# Patient Record
Sex: Male | Born: 1955
Health system: Southern US, Community
[De-identification: ages and names within clinical notes are randomized; demographics above are authoritative.]

## PROBLEM LIST (undated history)

## (undated) DIAGNOSIS — E785 Hyperlipidemia, unspecified: Secondary | ICD-10-CM

## (undated) DIAGNOSIS — R05 Cough: Secondary | ICD-10-CM

## (undated) DIAGNOSIS — R053 Chronic cough: Secondary | ICD-10-CM

## (undated) DIAGNOSIS — F410 Panic disorder [episodic paroxysmal anxiety] without agoraphobia: Secondary | ICD-10-CM

## (undated) DIAGNOSIS — K219 Gastro-esophageal reflux disease without esophagitis: Secondary | ICD-10-CM

## (undated) DIAGNOSIS — Z77090 Contact with and (suspected) exposure to asbestos: Secondary | ICD-10-CM

## (undated) DIAGNOSIS — G20 Parkinson's disease: Secondary | ICD-10-CM

## (undated) HISTORY — DX: Hyperlipidemia, unspecified: E78.5

## (undated) HISTORY — PX: TONSILLECTOMY: SUR1361

## (undated) HISTORY — DX: Contact with and (suspected) exposure to asbestos: Z77.090

## (undated) HISTORY — PX: COLONOSCOPY W/ POLYPECTOMY: SHX1380

## (undated) HISTORY — PX: APPENDECTOMY: SHX54

## (undated) HISTORY — PX: UPPER GASTROINTESTINAL ENDOSCOPY: SHX188

## (undated) HISTORY — DX: Parkinson's disease: G20

---

## 2000-01-01 ENCOUNTER — Other Ambulatory Visit: Admission: RE | Admit: 2000-01-01 | Discharge: 2000-01-01 | Payer: Self-pay | Admitting: Gastroenterology

## 2000-01-01 ENCOUNTER — Encounter (INDEPENDENT_AMBULATORY_CARE_PROVIDER_SITE_OTHER): Payer: Self-pay

## 2004-03-21 ENCOUNTER — Ambulatory Visit: Payer: Self-pay | Admitting: Gastroenterology

## 2004-03-23 ENCOUNTER — Ambulatory Visit: Payer: Self-pay | Admitting: Gastroenterology

## 2004-03-29 ENCOUNTER — Ambulatory Visit: Payer: Self-pay | Admitting: Gastroenterology

## 2005-01-12 ENCOUNTER — Ambulatory Visit: Payer: Self-pay | Admitting: Gastroenterology

## 2005-02-01 ENCOUNTER — Ambulatory Visit (HOSPITAL_COMMUNITY): Admission: RE | Admit: 2005-02-01 | Discharge: 2005-02-01 | Payer: Self-pay | Admitting: Gastroenterology

## 2005-02-01 ENCOUNTER — Ambulatory Visit: Payer: Self-pay | Admitting: Gastroenterology

## 2005-02-27 ENCOUNTER — Ambulatory Visit: Payer: Self-pay | Admitting: Gastroenterology

## 2005-12-13 ENCOUNTER — Encounter: Admission: RE | Admit: 2005-12-13 | Discharge: 2005-12-13 | Payer: Self-pay | Admitting: Gastroenterology

## 2010-04-19 ENCOUNTER — Encounter: Admission: RE | Admit: 2010-04-19 | Discharge: 2010-04-19 | Payer: Self-pay | Admitting: Orthopedic Surgery

## 2010-06-10 ENCOUNTER — Encounter: Payer: Self-pay | Admitting: Gastroenterology

## 2010-06-12 ENCOUNTER — Encounter: Payer: Self-pay | Admitting: Gastroenterology

## 2010-10-06 NOTE — Op Note (Signed)
NAMEJANZIEL, HOCKETT                ACCOUNT NO.:  [id number]   MEDICAL RECORD NO.:  [id number]          PATIENT TYPE:  AMB   LOCATION:  ENDO                         FACILITY:  [facility]   PHYSICIAN:  Vania Rea. Jarold Motto, M.D. [facility] OF BIRTH:  [birth year]   DATE OF PROCEDURE:  02/01/2005  DATE OF DISCHARGE:  02/01/2005                                 OPERATIVE REPORT   PROCEDURE:  Esophageal manometry.   1.  Upper esophageal sphincter:  There appears to be normal coordination      between pharyngeal contraction and cricopharyngeal relaxation.  2.  Lower esophageal sphincter:  Lower esophageal sphincter pressure is      borderline at 15 mmHg with normal relaxation on swallowing.  3.  Motility pattern:  There are normally-propagated peristaltic waves of      normal amplitude and duration throughout the length of the esophagus.      Mean amplitude of contractions is 102 mmHg.   ASSESSMENT:  This is a normal esophageal manometry except for a borderline  lower esophageal sphincter.  There is certainly no evidence of an esophageal  motility disorder.           ______________________________  Vania Rea. Jarold Motto, M.D. [facility]     DRP/MEDQ  D:  02/23/2005  T:  02/23/2005  Job:  [id number]

## 2010-10-06 NOTE — Op Note (Signed)
NAMELIMUEL, NIEBLAS                ACCOUNT NO.:  [id number]   MEDICAL RECORD NO.:  [id number]          PATIENT TYPE:  AMB   LOCATION:  ENDO                         FACILITY:  [facility]   PHYSICIAN:  Vania Rea. Jarold Motto, M.D. [facility] OF BIRTH:  [birth year]   DATE OF PROCEDURE:  02/02/2005  DATE OF DISCHARGE:  02/01/2005                                 OPERATIVE REPORT   PROCEDURE:  Ambulatory 24-hour pH probe study.   A 24-hour ambulatory pH study was completed on February 02, 2005.  The  patient is on Nexium 40 mg twice a day.  There is absolutely no acid reflux  whatsoever on this recording in the upright, recumbent or supine positions.  The patient had frequent chest pain throughout the examination, but there is  no correlation of any of his symptoms with acid reflux occurrences.   ASSESSMENT:  I sincerely doubt this patient is having acid reflux as a cause  for his very atypical chest pain.  He does have known Barrett's mucosa and a  small hiatal hernia, but he seems to have good control at this point in  terms of his acid reflux problems on twice a day PPI therapy.  I seriously  doubt he would benefit from fundoplication surgery.  We will see him in the  office for follow-up as scheduled.           ______________________________  Vania Rea. Jarold Motto, M.D. [facility]     DRP/MEDQ  D:  02/23/2005  T:  02/23/2005  Job:  [id number]

## 2010-11-07 ENCOUNTER — Other Ambulatory Visit: Payer: Self-pay | Admitting: Family Medicine

## 2010-11-07 DIAGNOSIS — R209 Unspecified disturbances of skin sensation: Secondary | ICD-10-CM

## 2010-11-14 ENCOUNTER — Ambulatory Visit
Admission: RE | Admit: 2010-11-14 | Discharge: 2010-11-14 | Disposition: A | Discharge status: Home / Self Care | Payer: Self-pay | Source: Ambulatory Visit | Attending: Family Medicine | Admitting: Family Medicine

## 2010-11-14 DIAGNOSIS — R209 Unspecified disturbances of skin sensation: Secondary | ICD-10-CM

## 2011-07-13 ENCOUNTER — Encounter: Payer: Self-pay | Admitting: Internal Medicine

## 2011-07-13 ENCOUNTER — Ambulatory Visit (INDEPENDENT_AMBULATORY_CARE_PROVIDER_SITE_OTHER): Payer: PRIVATE HEALTH INSURANCE | Admitting: Internal Medicine

## 2011-07-13 VITALS — BP 106/70 | HR 80 | Temp 98.4°F | Ht 66.0 in | Wt 172.0 lb

## 2011-07-13 DIAGNOSIS — R05 Cough: Secondary | ICD-10-CM

## 2011-07-13 DIAGNOSIS — R059 Cough, unspecified: Secondary | ICD-10-CM | POA: Insufficient documentation

## 2011-07-13 MED ORDER — FAMOTIDINE 20 MG PO TABS: ORAL_TABLET | ORAL | Status: DC

## 2011-07-13 MED ORDER — TRAMADOL HCL 50 MG PO TABS: ORAL_TABLET | ORAL | Status: AC

## 2011-07-13 MED ORDER — PREDNISONE (PAK) 10 MG PO TABS: ORAL_TABLET | ORAL | Status: AC

## 2011-07-13 NOTE — Progress Notes (Signed)
  Subjective:    Patient ID: Joseph Adams, male    DOB: 04/07/1956  MRN: [id number]  HPI   56 yo [place] never smoker with GERD maintained on ppi  Bid daily never allergies / asthma new onset cough 1st of 2013 referred 07/13/2011 by Dr Clarene Duke to pulmonary clinic.  07/13/2011 1st pulmonary eval cc  Dry cough onset abrupt onset with uri assoc with exp sick wife x 4 days prior to onset, she got better, around 1st of January rx as AB with mucinex and inhaler no better. rx with allergy meds no better. Even on ppi has fullness in epigrastrium assoc with occ sob at rest but no night time symptoms including the cough, and no real change sob with activity (not proportionate). Cough mostly daytime.  Sleeping ok without nocturnal  or early am exacerbation  of respiratory  c/o's or need for noct saba. Also denies any obvious fluctuation of symptoms with weather or environmental changes or other aggravating or alleviating factors except as outlined above      Review of Systems  Constitutional: Negative for fever, chills, activity change, appetite change and unexpected weight change.  HENT: Negative for congestion, sore throat, rhinorrhea, sneezing, trouble swallowing, dental problem, voice change and postnasal drip.   Eyes: Negative for visual disturbance.  Respiratory: Positive for cough. Negative for choking and shortness of breath.   Cardiovascular: Negative for chest pain and leg swelling.  Gastrointestinal: Negative for nausea, vomiting and abdominal pain.  Genitourinary: Negative for difficulty urinating.  Musculoskeletal: Negative for arthralgias.  Skin: Negative for rash.  Psychiatric/Behavioral: Negative for behavioral problems and confusion.       Objective:   Physical Exam  Wt 172  07/13/11 Very anxious [place] male speaks marginal engllish, nad HEENT: nl dentition, turbinates, and orophanx. Nl external ear canals without cough reflex   NECK :  without JVD/Nodes/TM/ nl  carotid upstrokes bilaterally   LUNGS: no acc muscle use, clear to A and P bilaterally without cough on insp or exp maneuvers   CV:  RRR  no s3 or murmur or increase in P2, no edema   ABD:  soft and nontender with nl excursion in the supine position. No bruits or organomegaly, bowel sounds nl  MS:  warm without deformities, calf tenderness, cyanosis or clubbing  SKIN: warm and dry without lesions    NEURO:  alert, approp, no deficits       Assessment & Plan:

## 2011-07-13 NOTE — Patient Instructions (Signed)
The key to effective treatment for your cough is eliminating the non-stop cycle of cough you're stuck in long enough to let your airway heal completely and then see if there is anything still making you cough once you stop the cough suppression, but this should take no more than 5 days to figuure out  First take delsym two tsp every 12 hours and supplement if needed with  tramadol 50 mg up to 2 every 4 hours to suppress the urge to cough. Swallowing water or using ice chips/non mint and menthol containing candies (such as lifesavers or sugarless jolly ranchers) are also effective.  You should rest your voice and avoid activities that you know make you cough.  Once you have eliminated the cough for 3 straight days try reducing the tramadol first,  then the delsym as tolerated.    Try protonix 40 Take 30-60 min before first meal  And last meal of the day and Pepcid 20 mg one bedtime until cough is completely gone for at least a week without the need for cough suppression  I think of reflux for chronic cough like I do oxygen for fire (doesn't cause the fire but once you get the oxygen suppressed it usually goes away regardless of the exact cause).  GERD (REFLUX)  is an extremely common cause of respiratory symptoms, many times with no significant heartburn at all.    It can be treated with medication, but also with lifestyle changes including avoidance of late meals, excessive alcohol, smoking cessation, and avoid fatty foods, chocolate, peppermint, colas, red wine, and acidic juices such as orange juice.  NO MINT OR MENTHOL PRODUCTS SO NO COUGH DROPS  USE SUGARLESS CANDY INSTEAD (jolley ranchers or Stover's)  NO OIL BASED VITAMINS - use powdered substitutes.    Prednisone 10 mg take  4 each am x 2 days,   2 each am x 2 days,  1 each am x2days and stop    If not better in 2 weeks return to clinic

## 2011-07-14 NOTE — Assessment & Plan Note (Signed)
The most common causes of chronic cough in immunocompetent adults include the following: upper airway cough syndrome (UACS), previously referred to as postnasal drip syndrome (PNDS), which is caused by variety of rhinosinus conditions; (2) asthma; (3) GERD; (4) chronic bronchitis from cigarette smoking or other inhaled environmental irritants; (5) nonasthmatic eosinophilic bronchitis; and (6) bronchiectasis.   These conditions, singly or in combination, have accounted for up to 94% of the causes of chronic cough in prospective studies.   Other conditions have constituted no >6% of the causes in prospective studies These have included bronchogenic carcinoma, chronic interstitial pneumonia, sarcoidosis, left ventricular failure, ACEI-induced cough, and aspiration from a condition associated with pharyngeal dysfunction.   .Chronic cough is often simultaneously caused by more than one condition. A single cause has been found from 38 to 82% of the time, multiple causes from 18 to 62%. Multiply caused cough has been the result of three diseases up to 42% of the time.     This is most c/w  Classic Upper airway cough syndrome, so named because it's frequently impossible to sort out how much is  CR/sinusitis with freq throat clearing (which can be related to primary GERD)   vs  causing  secondary (" extra esophageal")  GERD from wide swings in gastric pressure that occur with throat clearing, often  promoting self use of mint and menthol lozenges that reduce the lower esophageal sphincter tone and exacerbate the problem further in a cyclical fashion.   These are the same pts(now being labeled as having irritable larynx syndrome by some cough centers) who not infrequently have failed to tolerate ace inhibitors,  dry powder inhalers or biphosphonates or report having reflux symptoms that don't respond to standard doses of PPI , and are easily confused as having aecopd or asthma flares.  He has pre-existing reflux  symptoms that are hard to control and now refractory cough.  For now try max gerd rx then consider very short trial of reglan as he has failed to respond to approp trials of rx for pnds and asthma

## 2011-07-15 ENCOUNTER — Telehealth: Payer: Self-pay | Admitting: Pulmonary Disease

## 2011-07-15 NOTE — Telephone Encounter (Signed)
Pt called and c/o increased sob last night and this am that is not related to his cough.  He is better now, and talking in more than complete sentences.  Does not sound winded.  I have asked him to call the office first thing in the am to get appt to be seen on Monday.  If he worsens before then, he needs to go to er.

## 2011-07-16 ENCOUNTER — Ambulatory Visit: Payer: PRIVATE HEALTH INSURANCE | Admitting: Internal Medicine

## 2011-07-16 ENCOUNTER — Ambulatory Visit (INDEPENDENT_AMBULATORY_CARE_PROVIDER_SITE_OTHER): Payer: PRIVATE HEALTH INSURANCE | Admitting: Internal Medicine

## 2011-07-16 ENCOUNTER — Encounter: Payer: Self-pay | Admitting: Internal Medicine

## 2011-07-16 DIAGNOSIS — R05 Cough: Secondary | ICD-10-CM

## 2011-07-16 DIAGNOSIS — R0989 Other specified symptoms and signs involving the circulatory and respiratory systems: Secondary | ICD-10-CM

## 2011-07-16 DIAGNOSIS — R059 Cough, unspecified: Secondary | ICD-10-CM

## 2011-07-16 DIAGNOSIS — R0600 Dyspnea, unspecified: Secondary | ICD-10-CM

## 2011-07-16 NOTE — Assessment & Plan Note (Signed)
Improved on rx but prednisone may be fueling resting sob/ hyperventilation typical of anxiety so try off

## 2011-07-16 NOTE — Patient Instructions (Addendum)
The key to effective treatment for your cough is eliminating the non-stop cycle of cough you're stuck in long enough to let your airway heal completely and then see if there is anything still making you cough once you stop the cough suppression, but this should take no more than 5 days to figuure out  First take delsym two tsp every 12 hours and supplement if needed with  tramadol 50 mg up to 2 every 4 hours to suppress the urge to cough. Swallowing water or using ice chips/non mint and menthol containing candies (such as lifesavers or sugarless jolly ranchers) are also effective.  You should rest your voice and avoid activities that you know make you cough.  Once you have eliminated the cough for 3 straight days try reducing the tramadol first,  then the delsym as tolerated.    Try protonix 40 Take 30-60 min before first meal  And last meal of the day and Pepcid 20 mg one bedtime until cough is completely gone for at least a week without the need for cough suppression  I think of reflux for chronic cough like I do oxygen for fire (doesn't cause the fire but once you get the oxygen suppressed it usually goes away regardless of the exact cause).  GERD (REFLUX)  is an extremely common cause of respiratory symptoms, many times with no significant heartburn at all.    It can be treated with medication, but also with lifestyle changes including avoidance of late meals, excessive alcohol, smoking cessation, and avoid fatty foods, chocolate, peppermint, colas, red wine, and acidic juices such as orange juice.  NO MINT OR MENTHOL PRODUCTS SO NO COUGH DROPS  USE SUGARLESS CANDY INSTEAD (jolley ranchers or Stover's)  NO OIL BASED VITAMINS - use powdered substitutes.   Prednisone> ok to  stop at this point as it may be fueling your anxiety  Ok to use diazepam and tramadol together but together they will make you a little drowsier    If not better in 2 weeks return to clinic

## 2011-07-16 NOTE — Assessment & Plan Note (Addendum)
-   07/16/2011  Walked RA x 3 laps @ 185 ft each stopped due to  End of study, no desat   Symptoms are markedly disproportionate to objective findings and not reproducible with activity so   not clear this is actually a lung problem but pt does appear to have difficult airway management issues. DDX of  difficult airways managment all start with A and  include Adherence, Ace Inhibitors, Acid Reflux, Active Sinus Disease, Alpha 1 Antitripsin deficiency, Anxiety masquerading as Airways dz,  ABPA,  allergy(esp in young), Aspiration (esp in elderly), Adverse effects of DPI,  Active smokers, plus two Bs  = Bronchiectasis and Beta blocker use..and one C= CHF  Anxiety usually a dx of exclusion but in this case it is a longstanding issue.   acid reflux > reviewed diet

## 2011-07-16 NOTE — Progress Notes (Signed)
Subjective:    Patient ID: Joseph Adams, male    DOB: [birth year]  MRN: [id number]  HPI   56 yo [place] never smoker with GERD maintained on ppi  Bid daily never allergies / asthma but cc sob typically at rest, never with rugby,  Happens sporadically  new onset cough 1st of 2013 referred 07/13/2011 by Dr Clarene Duke to pulmonary clinic.  07/13/2011 1st pulmonary eval cc  Dry cough onset abrupt onset with uri assoc with exp sick wife x 4 days prior to onset, she got better, around 1st of January rx as AB with mucinex and inhaler no better. rx with allergy meds no better. Even on ppi has fullness in epigrastrium assoc with occ sob at rest but no night time symptoms including the cough, and no real change sob with activity (not proportionate). Cough mostly daytime. rec The key to effective treatment for your cough is eliminating the non-stop cycle of cough you're stuck in long enough to let your airway heal completely and then see if there is anything still making you cough once you stop the cough suppression, but this should take no more than 5 days to figuure out  First take delsym two tsp every 12 hours and supplement if needed with  tramadol 50 mg up to 2 every 4 hours to suppress the urge to cough. Swallowing water or using ice chips/non mint and menthol containing candies (such as lifesavers or sugarless jolly ranchers) are also effective.  You should rest your voice and avoid activities that you know make you cough. Once you have eliminated the cough for 3 straight days try reducing the tramadol first,  then the delsym as tolerated.   Try protonix 40 Take 30-60 min before first meal  And last meal of the day and Pepcid 20 mg one bedtime until cough is completely gone  GERD  diet Prednisone 10 mg take  4 each am x 2 days,   2 each am x 2 days,  1 each am x2days and stop       07/16/2011 f/u ov/Bilan Tedesco cc much worse sob at rest p prednisone started acutely worse since 2/23.  Cough much better/  symptom of sob at rest comes and goes and dates back to childhood but never sleeping or with exercise including rugby.  Better p uses diazepam  Sleeping ok without nocturnal  or early am exacerbation  of respiratory  c/o's or need for noct saba. Also denies any obvious fluctuation of symptoms with weather or environmental changes or other aggravating or alleviating factors except as outlined above.  ROS  At present neg for  any significant sore throat, dysphagia, itching, sneezing,  nasal congestion or excess/ purulent secretions,  fever, chills, sweats, unintended wt loss, pleuritic or exertional cp, hempoptysis, orthopnea pnd or leg swelling.  Also denies presyncope, palpitations, heartburn, abdominal pain, nausea, vomiting, diarrhea  or change in bowel or urinary habits, dysuria,hematuria,  rash, arthralgias, visual complaints, headache, numbness weakness or ataxia.        .       Objective:   Physical Exam  Wt 172  07/13/11 > 172 07/16/2011   Very anxious [place] male speaks marginal engllish, nad x freq sigh breathing noted at rest  HEENT: nl dentition, turbinates, and orophanx. Nl external ear canals without cough reflex   NECK :  without JVD/Nodes/TM/ nl carotid upstrokes bilaterally   LUNGS: no acc muscle use, clear to A and P bilaterally without cough on insp or  exp maneuvers   CV:  RRR  no s3 or murmur or increase in P2, no edema   ABD:  soft and nontender with nl excursion in the supine position. No bruits or organomegaly, bowel sounds nl  MS:  warm without deformities, calf tenderness, cyanosis or clubbing          Assessment & Plan:

## 2011-07-23 ENCOUNTER — Telehealth: Payer: Self-pay | Admitting: Internal Medicine

## 2011-07-23 NOTE — Telephone Encounter (Signed)
i pulled up our [facility] system and was able to view pt's cxr that was done 06/21/11.  Called and spoke with Clydie Braun in Radiology at [facility] and informed her of this.  Nothing further needed.

## 2011-07-23 NOTE — Telephone Encounter (Signed)
Nothing else to offer but needs to see Tammy with all meds in hand in meantime if not able to wait until Friday

## 2011-07-23 NOTE — Telephone Encounter (Signed)
C/o chest tightness, productive cough x 2 days, some SOB having to "yawn to catch air". Has been taking Delsym every 12 hours and when asked if he has been using the Tramadol he advised MW told him not to take it because of the SOB... Pt states he is not sure if MW reviewed his xray from [place] and he is "concerned something is wrong and does not want to wait until Friday." Please advise, thanks.  Denies n/v/d/f/s, wheezing.

## 2011-07-23 NOTE — Telephone Encounter (Signed)
Pt aware and states he can wait until Friday. Carron Curie, CMA

## 2011-07-25 ENCOUNTER — Telehealth: Payer: Self-pay | Admitting: Internal Medicine

## 2011-07-25 ENCOUNTER — Ambulatory Visit (INDEPENDENT_AMBULATORY_CARE_PROVIDER_SITE_OTHER)
Admission: RE | Admit: 2011-07-25 | Discharge: 2011-07-25 | Disposition: A | Discharge status: Home / Self Care | Payer: PRIVATE HEALTH INSURANCE | Source: Ambulatory Visit | Attending: Pulmonary Disease | Admitting: Pulmonary Disease

## 2011-07-25 ENCOUNTER — Ambulatory Visit (INDEPENDENT_AMBULATORY_CARE_PROVIDER_SITE_OTHER): Payer: PRIVATE HEALTH INSURANCE | Admitting: Pulmonary Disease

## 2011-07-25 ENCOUNTER — Encounter: Payer: Self-pay | Admitting: Pulmonary Disease

## 2011-07-25 VITALS — BP 102/80 | HR 76 | Temp 98.3°F | Ht 66.0 in | Wt 174.0 lb

## 2011-07-25 DIAGNOSIS — R059 Cough, unspecified: Secondary | ICD-10-CM

## 2011-07-25 DIAGNOSIS — R05 Cough: Secondary | ICD-10-CM

## 2011-07-25 NOTE — Patient Instructions (Signed)
Chest xray today Will schedule breathing test (PFT) Follow up with Dr. Sherene Sires in 2 weeks

## 2011-07-25 NOTE — Telephone Encounter (Signed)
I spoke with the pt and he is c/o increased SOB, and cough and chest pain on right side x 2 days. He is requesting an appt today. He has an appt to see MW on Friday but he feels like he cannot wait until then. Pt set to see Dr. Craige Cotta today at 2pm. Carron Curie, CMA

## 2011-07-25 NOTE — Assessment & Plan Note (Signed)
He reports developing cough after upper respiratory infection in January 2013.  He has inconsistent response to asthma therapy.  He has not had improvement with anti-reflux therapy.  He denies symptoms of sinus disease or post-nasal drip.  He may have a component of anxiety contributing to his symptoms.  Will repeat his chest xray today, and arrange for pulmonary function testing to further assess.  I explained that we will be able to assess whether his symptoms are related to his prior history of asbestosis exposure after reviewing his chest xray.  However, I do not think this is a significant contributor to his symptoms.  He has noticed symptomatic improvement with delsym, and I have advised him to continue this for now.

## 2011-07-25 NOTE — Progress Notes (Signed)
Chief Complaint  Patient presents with  . Acute visit    MW patient...pt c/o increased sob every morning, prod and non-prod cough present    History of Present Illness: Joseph Adams is a 56 y.o. male never smoker with dyspnea, cough and GERD followed by Dr. Sherene Sires.  He developed a cold in January.  He has suffered from a cough every since.  He was started on inhaler therapy and allergy therapy, but these did not help.  He was then given prednisone.  This helped, but then he was weaned off.  His cough does not happen at night.  He will usually have a dry cough, but occasionally has clear sputum.  He does not have wheeze.  He denies sinus congestion, post-nasal drip, or ear pain.    He takes protonix twice per day, and pepcid at night.  This regimen has not improved his cough.  He does get a sore throat, but denies globus.  He works with [employer].  He denies smoking.  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  He has not had any recent sick exposure.  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  He will notice feeling anxious when he coughs, and then feels like he can't breath.  He has to then step out to get some air.  He has used valium, and this helps.  He is not sure if the anxious feeling precedes his cough and dyspnea.  He denies chest pain.  Past Medical History  Diagnosis Date  . Asbestos exposure   . Hyperlipidemia     Past Surgical History  Procedure Date  . Appendectomy age 35    No Known Allergies  Physical Exam:  Blood pressure 102/80, pulse 76, temperature 98.3 F (36.8 C), temperature source Oral, height 5\' 6"  (1.676 m), weight 174 lb (78.926 kg), SpO2 98.00%. Body mass index is 28.08 kg/(m^2). Wt Readings from Last 2 Encounters:  07/25/11 174 lb (78.926 kg)  07/16/11 172 lb (78.019 kg)    General - No distress HEENT - No sinus tenderness, TM clear, no oral exudate, no LAN Cardiac - s1s2 regular, no murmur Chest - no  wheeze/rales/dullness, normal respiratory excursion Abdomen - soft, nontender, no organomegaly Extremities - no e/c/c Skin - no rashes Neurologic - normal strength, CN intact Psychiatric - flat affect, calm/appropriate   Assessment/Plan:  Outpatient Encounter Prescriptions as of 07/25/2011  Medication Sig Dispense Refill  . atorvastatin (LIPITOR) 40 MG tablet Take 40 mg by mouth daily.      Marland Kitchen dextromethorphan (DELSYM) 30 MG/5ML liquid Take 30 mg by mouth 2 (two) times daily as needed.      . famotidine (PEPCID) 20 MG tablet One at bedtime  30 tablet  11  . pantoprazole (PROTONIX) 40 MG tablet Take 40 mg by mouth 2 (two) times daily.      . tadalafil (CIALIS) 10 MG tablet Take 10 mg by mouth daily as needed.        Yarielys Beed Pager:  [phone] 07/25/2011, 2:25 PM

## 2011-07-26 ENCOUNTER — Telehealth: Payer: Self-pay | Admitting: Pulmonary Disease

## 2011-07-26 NOTE — Telephone Encounter (Signed)
Dr Craige Cotta please advise nurse of xray results to call pt

## 2011-07-26 NOTE — Telephone Encounter (Signed)
Dg Chest 2 View  07/25/2011  *RADIOLOGY REPORT*  Clinical Data: Chronic cough, shortness of breath.  CHEST - 2 VIEW  Comparison: 06/21/2011.  Findings: Heart and mediastinal contours are within normal limits. No focal opacities or effusions.  No acute bony abnormality.  IMPRESSION: No active cardiopulmonary disease.  No change.  Original Report Authenticated By: Cyndie Chime, M.D.    Please inform patient that chest xray was normal.  Will forward results to Dr. Sherene Sires for his review.

## 2011-07-26 NOTE — Telephone Encounter (Signed)
Called and spoke with pt. Informed him of cxr results. Pt verbalized understanding.

## 2011-07-27 ENCOUNTER — Institutional Professional Consult (permissible substitution): Payer: PRIVATE HEALTH INSURANCE | Admitting: Pulmonary Disease

## 2011-07-27 ENCOUNTER — Ambulatory Visit: Payer: PRIVATE HEALTH INSURANCE | Admitting: Internal Medicine

## 2011-08-07 ENCOUNTER — Encounter (HOSPITAL_COMMUNITY): Payer: PRIVATE HEALTH INSURANCE

## 2011-08-07 ENCOUNTER — Ambulatory Visit: Payer: PRIVATE HEALTH INSURANCE | Admitting: Internal Medicine

## 2011-08-16 ENCOUNTER — Ambulatory Visit (INDEPENDENT_AMBULATORY_CARE_PROVIDER_SITE_OTHER): Payer: PRIVATE HEALTH INSURANCE | Admitting: Pulmonary Disease

## 2011-08-16 DIAGNOSIS — R059 Cough, unspecified: Secondary | ICD-10-CM

## 2011-08-16 DIAGNOSIS — R05 Cough: Secondary | ICD-10-CM

## 2011-08-16 DIAGNOSIS — R0609 Other forms of dyspnea: Secondary | ICD-10-CM

## 2011-08-16 DIAGNOSIS — R0989 Other specified symptoms and signs involving the circulatory and respiratory systems: Secondary | ICD-10-CM

## 2011-08-16 DIAGNOSIS — R0600 Dyspnea, unspecified: Secondary | ICD-10-CM

## 2011-08-16 LAB — PULMONARY FUNCTION TEST

## 2011-08-16 NOTE — Progress Notes (Signed)
PFT done today. 

## 2011-08-21 ENCOUNTER — Telehealth: Payer: Self-pay | Admitting: Internal Medicine

## 2011-08-21 NOTE — Telephone Encounter (Signed)
I advised pt MW nor VS was in the office to give him his PFT results and that he has an apt for Friday to review these. He was fine with this and needed nothing further

## 2011-08-24 ENCOUNTER — Encounter: Payer: Self-pay | Admitting: Internal Medicine

## 2011-08-24 ENCOUNTER — Other Ambulatory Visit: Payer: PRIVATE HEALTH INSURANCE

## 2011-08-24 ENCOUNTER — Other Ambulatory Visit (INDEPENDENT_AMBULATORY_CARE_PROVIDER_SITE_OTHER): Payer: PRIVATE HEALTH INSURANCE

## 2011-08-24 ENCOUNTER — Ambulatory Visit (INDEPENDENT_AMBULATORY_CARE_PROVIDER_SITE_OTHER): Payer: PRIVATE HEALTH INSURANCE | Admitting: Internal Medicine

## 2011-08-24 VITALS — BP 110/86 | HR 94 | Temp 98.6°F | Ht 66.0 in | Wt 160.2 lb

## 2011-08-24 DIAGNOSIS — R0600 Dyspnea, unspecified: Secondary | ICD-10-CM

## 2011-08-24 DIAGNOSIS — R059 Cough, unspecified: Secondary | ICD-10-CM

## 2011-08-24 DIAGNOSIS — R0989 Other specified symptoms and signs involving the circulatory and respiratory systems: Secondary | ICD-10-CM

## 2011-08-24 DIAGNOSIS — R05 Cough: Secondary | ICD-10-CM

## 2011-08-24 LAB — CBC WITH DIFFERENTIAL/PLATELET
Basophils Relative: 0.2 % (ref 0.0–3.0)
Eosinophils Absolute: 0 10*3/uL (ref 0.0–0.7)
HCT: 44.1 % (ref 39.0–52.0)
Lymphs Abs: 0.8 10*3/uL (ref 0.7–4.0)
MCHC: 33.5 g/dL (ref 30.0–36.0)
MCV: 87.2 fl (ref 78.0–100.0)
Monocytes Absolute: 0.5 10*3/uL (ref 0.1–1.0)
Neutrophils Relative %: 80 % — ABNORMAL HIGH (ref 43.0–77.0)
Platelets: 216 10*3/uL (ref 150.0–400.0)
RBC: 5.06 Mil/uL (ref 4.22–5.81)

## 2011-08-24 MED ORDER — TRAMADOL HCL 50 MG PO TABS: ORAL_TABLET | ORAL | Status: AC

## 2011-08-24 MED ORDER — PREDNISONE (PAK) 10 MG PO TABS: ORAL_TABLET | ORAL | Status: DC

## 2011-08-24 MED ORDER — PREDNISONE (PAK) 10 MG PO TABS: ORAL_TABLET | ORAL | Status: AC

## 2011-08-24 NOTE — Patient Instructions (Addendum)
The key to effective treatment for your cough is eliminating the non-stop cycle of cough you're stuck in long enough to let your airway heal completely and then see if there is anything still making you cough once you stop the cough suppression, but this should take no more than 5 days to figuure out  First take delsym two tsp every 12 hours and supplement if needed with  tramadol 50 mg up to 2 every 4 hours to suppress the urge to cough. Swallowing water or using ice chips/non mint and menthol containing candies (such as lifesavers or sugarless jolly ranchers) are also effective.  You should rest your voice and avoid activities that you know make you cough.  Once you have eliminated the cough for 3 straight days try reducing the tramadol first,  then the delsym as tolerated.    Try protonix 40 Take 30-60 min before first meal  And last meal of the day and Pepcid 20 mg one bedtime  GERD (REFLUX)  is an extremely common cause of respiratory symptoms, many times with no significant heartburn at all.    It can be treated with medication, but also with lifestyle changes including avoidance of late meals, excessive alcohol, smoking cessation, and avoid fatty foods, chocolate, peppermint, colas, red wine, and acidic juices such as orange juice.  NO MINT OR MENTHOL PRODUCTS SO NO COUGH DROPS  USE SUGARLESS CANDY INSTEAD (jolley ranchers or Stover's)  NO OIL BASED VITAMINS - use powdered substitutes.   Prednisone restart but if can't tolerate ok to stop  Please remember to go to the lab   department downstairs for your tests - we will call you with the results when they are available- if this looks like an allergy issue I will be referring you to Dr Beaulah Dinning   Please see patient coordinator before you leave today  to schedule sinus ct and chest ct and we will call you with results

## 2011-08-24 NOTE — Assessment & Plan Note (Signed)
?   Steroid responsive component to cough suggests asthma, eos rhinitis or bronchitis but pt is convinced he has a serious lung problem, a common theme in pts with refractory cough syndrome  Next step in the w/u is eval for sinusitis/ structural lung dz  With sinus/ chest ct and proceed with allergy w/u as well > if positive plan to refer to [facility] as have had very difficult time communicating details of care to pt in [place].  See instructions for specific recommendations which were reviewed directly with the patient who was given a copy with highlighter outlining the key components.

## 2011-08-24 NOTE — Assessment & Plan Note (Signed)
-   07/16/2011  Walked RA x 3 laps @ 185 ft each stopped due to  End of study, no desat     - PFT's 08/24/2011 FEV1  3.33 (110%) ratio 77 and DLC0 102%, with severe insp truncation   Symptoms are markedly disproportionate to objective findings and not clear this is even a lung problem but pt does appear to have difficult airway management issues. DDX of  difficult airways managment all start with A and  include Adherence, Ace Inhibitors, Acid Reflux, Active Sinus Disease, Alpha 1 Antitripsin deficiency, Anxiety masquerading as Airways dz,  ABPA,  allergy(esp in young), Aspiration (esp in elderly), Adverse effects of DPI,  Active smokers, plus two Bs  = Bronchiectasis and Beta blocker use..and one C= CHF  Acid reflux needs continued aggressive rx as is coughing and sinus ct will be completed next also  Anxiety usually a dx of exclusion but may be an important component here (wife strongly thinks so)

## 2011-08-24 NOTE — Progress Notes (Signed)
Subjective:    Patient ID: Joseph Adams, male    DOB: 03-Aug-1955  MRN: [id number]    Brief patient profile:  56 yo [place] never smoker with GERD maintained on ppi  Bid daily never allergies / asthma but cc sob typically at rest, never with rugby,  Happens sporadically  new onset cough 1st of 2013 referred 07/13/2011 by Dr Clarene Duke to pulmonary clinic.  07/13/2011 1st pulmonary eval cc  Dry cough onset abrupt onset with uri assoc with exp sick wife x 4 days prior to onset, she got better, around 1st of January rx as AB with mucinex and inhaler no better. rx with allergy meds no better. Even on ppi has fullness in epigrastrium assoc with occ sob at rest but no night time symptoms including the cough, and no real change sob with activity (not proportionate). Cough mostly daytime. rec The key to effective treatment for your cough is eliminating the non-stop cycle of cough you're stuck in long enough to let your airway heal completely and then see if there is anything still making you cough once you stop the cough suppression, but this should take no more than 5 days to figure out  First take delsym two tsp every 12 hours and supplement if needed with  tramadol 50 mg up to 2 every 4 hours to suppress the urge to cough. Swallowing water or using ice chips/non mint and menthol containing candies (such as lifesavers or sugarless jolly ranchers) are also effective.  You should rest your voice and avoid activities that you know make you cough. Once you have eliminated the cough for 3 straight days try reducing the tramadol first,  then the delsym as tolerated.   Try protonix 40 Take 30-60 min before first meal  And last meal of the day and Pepcid 20 mg one bedtime until cough is completely gone  GERD  diet Prednisone 10 mg take  4 each am x 2 days,   2 each am x 2 days,  1 each am x2days and stop       07/16/2011 f/u ov/Joseph Adams cc much worse sob at rest p prednisone started acutely worse since 2/23.  Cough  much better/ symptom of sob at rest comes and goes and dates back to childhood but never sleeping or with exercise including rugby.  Better p uses diazepam. The key to effective treatment for your cough is eliminating the non-stop cycle of cough you're stuck in long enough to let your airway heal completely and then see if there is anything still making you cough once you stop the cough suppression, but this should take no more than 5 days to figure out  First take delsym two tsp every 12 hours and supplement if needed with  tramadol 50 mg up to 2 every 4 hours to suppress the urge to cough.    Try protonix 40 Take 30-60 min before first meal  And last meal of the day and Pepcid 20 mg one bedtime until cough is completely gone for at least a week without the need for cough suppression GERD  Prednisone> ok to  stop at this point as it may be fueling your anxiety Ok to use diazepam and tramadol together  By March 9th 100% better and stopped the medication for a bout a week, then cough returned even after added back the pepcid 20 mg at bedtime.   08/24/2011 f/u ov/Joseph Adams cc persistent daily severe cough which starts about 20 min p stirring in  am peaks in am and gradually tapers off but brings up sev tbsp white mucus total per day. Convinced this is caused by previous asbestos exposure. No overt sinus or hb symptoms, really not sob.  Sleeping ok without nocturnal  or early am exacerbation  of respiratory  c/o's or need for noct saba. Also denies any obvious fluctuation of symptoms with weather or environmental changes or other aggravating or alleviating factors except as outlined above.  ROS  At present neg for  any significant sore throat, dysphagia, itching, sneezing,  nasal congestion or excess/ purulent secretions,  fever, chills, sweats, unintended wt loss, pleuritic or exertional cp, hempoptysis, orthopnea pnd or leg swelling.  Also denies presyncope, palpitations, heartburn, abdominal pain, nausea,  vomiting, diarrhea  or change in bowel or urinary habits, dysuria,hematuria,  rash, arthralgias, visual complaints, headache, numbness weakness or ataxia.        .       Objective:   Physical Exam  Wt 172  07/13/11 > 172 07/16/2011 > 08/24/2011  160  Very anxious [place] male speaks marginal engllish, flat affect  HEENT: nl dentition, turbinates, and orophanx. Nl external ear canals without cough reflex   NECK :  without JVD/Nodes/TM/ nl carotid upstrokes bilaterally   LUNGS: no acc muscle use, clear to A and P bilaterally without cough on insp or exp maneuvers   CV:  RRR  no s3 or murmur or increase in P2, no edema   ABD:  soft and nontender with nl excursion in the supine position. No bruits or organomegaly, bowel sounds nl  MS:  warm without deformities, calf tenderness, cyanosis or clubbing   07/25/11 cxr No active cardiopulmonary disease.          Assessment & Plan:

## 2011-08-24 NOTE — Progress Notes (Deleted)
Chief Complaint  Patient presents with  . Acute visit    MW patient...pt c/o increased sob every morning, prod and non-prod cough present    History of Present Illness: Joseph Adams is a 56 y.o. male never smoker with dyspnea, cough and GERD followed by Dr. Sherene Sires.  He developed a cold in January.  He has suffered from a cough every since.  He was started on inhaler therapy and allergy therapy, but these did not help.  He was then given prednisone.  This helped, but then he was weaned off.  His cough does not happen at night.  He will usually have a dry cough, but occasionally has clear sputum.  He does not have wheeze.  He denies sinus congestion, post-nasal drip, or ear pain.    He takes protonix twice per day, and pepcid at night.  This regimen has not improved his cough.  He does get a sore throat, but denies globus.  He works with [employer].  He denies smoking.  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  He has not had any recent sick exposure.  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  He will notice feeling anxious when he coughs, and then feels like he can't breath.  He has to then step out to get some air.  He has used valium, and this helps.  He is not sure if the anxious feeling precedes his cough and dyspnea.  He denies chest pain.  Past Medical History  Diagnosis Date  . Asbestos exposure   . Hyperlipidemia     Past Surgical History  Procedure Date  . Appendectomy age 26    No Known Allergies  Physical Exam:  Blood pressure 102/80, pulse 76, temperature 98.3 F (36.8 C), temperature source Oral, height 5\' 6"  (1.676 m), weight 174 lb (78.926 kg), SpO2 98.00%. Body mass index is 28.08 kg/(m^2). Wt Readings from Last 2 Encounters:  07/25/11 174 lb (78.926 kg)  07/16/11 172 lb (78.019 kg)    General - No distress HEENT - No sinus tenderness, TM clear, no oral exudate, no LAN Cardiac - s1s2 regular, no murmur Chest - no  wheeze/rales/dullness, normal respiratory excursion Abdomen - soft, nontender, no organomegaly Extremities - no e/c/c Skin - no rashes Neurologic - normal strength, CN intact Psychiatric - flat affect, calm/appropriate   Assessment/Plan:  Outpatient Encounter Prescriptions as of 07/25/2011  Medication Sig Dispense Refill  . atorvastatin (LIPITOR) 40 MG tablet Take 40 mg by mouth daily.      Marland Kitchen dextromethorphan (DELSYM) 30 MG/5ML liquid Take 30 mg by mouth 2 (two) times daily as needed.      . famotidine (PEPCID) 20 MG tablet One at bedtime  30 tablet  11  . pantoprazole (PROTONIX) 40 MG tablet Take 40 mg by mouth 2 (two) times daily.      . tadalafil (CIALIS) 10 MG tablet Take 10 mg by mouth daily as needed.        SOOD,VINEET Pager:  [phone] 07/25/2011, 2:25 PM

## 2011-08-27 ENCOUNTER — Telehealth: Payer: Self-pay | Admitting: Internal Medicine

## 2011-08-27 ENCOUNTER — Other Ambulatory Visit: Payer: PRIVATE HEALTH INSURANCE

## 2011-08-27 NOTE — Telephone Encounter (Signed)
Pt aware that CT chest was approved and will send this message back as FYI for MW to work out the sinus CT and let patient know the outcome.

## 2011-08-27 NOTE — Telephone Encounter (Signed)
MW, please advise what you would like to do; patient states that insurance is requesting another test done before they will cover the CT chest and will not cover the CT sinus either.

## 2011-08-27 NOTE — Telephone Encounter (Signed)
Ct was approved for the chest and I have to call the md to get the sinus added but we'll work it out where he only has to go one time to complete the w/u.

## 2011-08-27 NOTE — Telephone Encounter (Signed)
LMTCB

## 2011-08-28 ENCOUNTER — Encounter: Payer: Self-pay | Admitting: Internal Medicine

## 2011-08-28 LAB — ALLERGY PROFILE REGION II-DC, DE, MD, ~~LOC~~, VA
Aspergillus fumigatus, IgG: <0.1 kU/L (ref <0.35)
Bermuda Grass: <0.1 kU/L (ref <0.35)
Cladosporium Herbarum: <0.1 kU/L (ref <0.35)
Cockroach: <0.1 kU/L (ref <0.35)
Common Ragweed: <0.1 kU/L (ref <0.35)
D. farinae: <0.1 kU/L (ref <0.35)
IgE (Immunoglobulin E), Serum: 1.9 IU/mL (ref 0.0–180.0)
Lamb's Quarters: <0.1 kU/L (ref <0.35)
Meadow Grass: <0.1 kU/L (ref <0.35)
Oak: <0.1 kU/L (ref <0.35)

## 2011-08-29 ENCOUNTER — Telehealth: Payer: Self-pay | Admitting: Internal Medicine

## 2011-08-29 ENCOUNTER — Telehealth: Payer: Self-pay | Admitting: Critical Care Medicine

## 2011-08-29 NOTE — Telephone Encounter (Signed)
I spoke with pt and advised him of MW response yesterday  Sandrea Hughs, MD 08/27/2011 5:13 PM Signed  Ct was approved for the chest and I have to call the md to get the sinus added but we'll work it out where he only has to go one time to complete the w/   He voiced his understanding and had no further questions

## 2011-08-29 NOTE — Telephone Encounter (Signed)
Spoke with pt. He states that he got a call from [employer] that both ct sinus and ct chest were approved. Wants these scheduled same day at [facility]. Will forward to [facility] to schedule, thanks!

## 2011-08-30 ENCOUNTER — Telehealth: Payer: Self-pay | Admitting: Internal Medicine

## 2011-08-30 DIAGNOSIS — R05 Cough: Secondary | ICD-10-CM

## 2011-08-30 DIAGNOSIS — R059 Cough, unspecified: Secondary | ICD-10-CM

## 2011-08-30 NOTE — Telephone Encounter (Signed)
Pt scheduled to go to [facility] on [address] today@2 :00pm for chest and sinus ct pt is aware and precert# [id number]

## 2011-08-30 NOTE — Telephone Encounter (Signed)
Pt dropped off his ct sinus and chest films. I have placed in MW's office and called [facility] and requested that they fax [place] the reports once available. I advised pt we will call with results once MW reviews.

## 2011-09-03 ENCOUNTER — Encounter: Payer: Self-pay | Admitting: Internal Medicine

## 2011-09-03 ENCOUNTER — Telehealth: Payer: Self-pay

## 2011-09-03 NOTE — Telephone Encounter (Signed)
09/03/2011 12:36 PM Phone (Incoming) Poppe, [place] B (Self) [phone] (M) pt is wanting results bad please call pt with these or explain what the hold up is  (Copied from Chantel's office note.).  Antionette Fairy

## 2011-09-03 NOTE — Telephone Encounter (Signed)
I spoke with the pt and advised of results. Pt states his cough is not any better and would like a referral to Dr. Beaulah Dinning. Order placed. Pt is aware.Carron Curie, CMA

## 2011-09-03 NOTE — Telephone Encounter (Addendum)
Pt made an appt w/ MW for 09/18/11, but also wants an earlier appt if possible to discuss results from CXR.   Joseph Adams

## 2011-09-03 NOTE — Telephone Encounter (Signed)
Ct chest neg CT sinus minimal thickening Need eval by [facility] next step if not happy with symptom control

## 2011-09-03 NOTE — Telephone Encounter (Signed)
Duplicate messaged.  Copied & pasted into 08/30/11 message.  Antionette Fairy

## 2011-09-04 ENCOUNTER — Encounter: Payer: Self-pay | Admitting: Pulmonary Disease

## 2011-09-08 ENCOUNTER — Other Ambulatory Visit: Payer: Self-pay | Admitting: Neurosurgery

## 2011-09-10 ENCOUNTER — Encounter (HOSPITAL_COMMUNITY): Payer: Self-pay | Admitting: Respiratory Therapy

## 2011-09-13 NOTE — Pre-Procedure Instructions (Signed)
20 Joseph Adams  09/13/2011   Your procedure is scheduled on:  Wednesday Sep 19, 2011  Report to [facility] at 0630 AM.  Call this number if you have problems the morning of surgery: [phone]   Remember:   Do not eat food:After Midnight.  May have clear liquids: up to 4 Hours before arrival. (up to 2:30am)  Clear liquids include soda, tea, black coffee, apple or grape juice, broth.  Take these medicines the morning of surgery with A SIP OF WATER: pepcid, protonix   Do not wear jewelry, make-up or nail polish.  Do not wear lotions, powders, or perfumes. You may wear deodorant.  Do not shave 48 hours prior to surgery.  Do not bring valuables to the hospital.  Contacts, dentures or bridgework may not be worn into surgery.  Leave suitcase in the car. After surgery it may be brought to your room.  For patients admitted to the hospital, checkout time is 11:00 AM the day of discharge.   Patients discharged the day of surgery will not be allowed to drive home.  Name and phone number of your driver: *n/a**  Special Instructions: CHG Shower Use Special Wash: 1/2 bottle night before surgery and 1/2 bottle morning of surgery.   Please read over the following fact sheets that you were given: Pain Booklet, Coughing and Deep Breathing, MRSA Information and Surgical Site Infection Prevention

## 2011-09-14 ENCOUNTER — Encounter (HOSPITAL_COMMUNITY)
Admission: RE | Admit: 2011-09-14 | Discharge: 2011-09-14 | Disposition: A | Discharge status: Home / Self Care | Payer: PRIVATE HEALTH INSURANCE | Source: Ambulatory Visit | Attending: Neurosurgery | Admitting: Neurosurgery

## 2011-09-14 ENCOUNTER — Encounter: Payer: Self-pay | Admitting: Internal Medicine

## 2011-09-14 ENCOUNTER — Encounter (HOSPITAL_COMMUNITY): Payer: Self-pay

## 2011-09-14 DIAGNOSIS — R0609 Other forms of dyspnea: Secondary | ICD-10-CM | POA: Insufficient documentation

## 2011-09-14 DIAGNOSIS — R0989 Other specified symptoms and signs involving the circulatory and respiratory systems: Secondary | ICD-10-CM | POA: Insufficient documentation

## 2011-09-14 DIAGNOSIS — R05 Cough: Secondary | ICD-10-CM | POA: Insufficient documentation

## 2011-09-14 DIAGNOSIS — R059 Cough, unspecified: Secondary | ICD-10-CM | POA: Insufficient documentation

## 2011-09-14 HISTORY — DX: Cough: R05

## 2011-09-14 HISTORY — DX: Gastro-esophageal reflux disease without esophagitis: K21.9

## 2011-09-14 HISTORY — DX: Chronic cough: R05.3

## 2011-09-14 HISTORY — DX: Panic disorder (episodic paroxysmal anxiety): F41.0

## 2011-09-14 LAB — CBC
HCT: 41.8 % (ref 39.0–52.0)
Hemoglobin: 14.4 g/dL (ref 13.0–17.0)
MCH: 29.8 pg (ref 26.0–34.0)
MCHC: 34.4 g/dL (ref 30.0–36.0)
MCV: 86.4 fL (ref 78.0–100.0)

## 2011-09-14 LAB — SURGICAL PCR SCREEN: Staphylococcus aureus: NEGATIVE

## 2011-09-18 ENCOUNTER — Encounter: Payer: PRIVATE HEALTH INSURANCE | Admitting: Internal Medicine

## 2011-09-18 NOTE — Progress Notes (Signed)
 This encounter was created in error - please disregard.

## 2011-09-19 ENCOUNTER — Ambulatory Visit (HOSPITAL_COMMUNITY): Admission: RE | Admit: 2011-09-19 | Payer: PRIVATE HEALTH INSURANCE | Source: Ambulatory Visit | Admitting: Neurosurgery

## 2011-09-19 ENCOUNTER — Encounter (HOSPITAL_COMMUNITY): Admission: RE | Payer: Self-pay | Source: Ambulatory Visit

## 2011-09-19 SURGERY — ANTERIOR CERVICAL DECOMPRESSION/DISCECTOMY FUSION 4 LEVELS
Anesthesia: General

## 2011-09-26 ENCOUNTER — Telehealth: Payer: Self-pay | Admitting: Internal Medicine

## 2011-09-26 ENCOUNTER — Encounter: Payer: Self-pay | Admitting: Internal Medicine

## 2011-09-26 NOTE — Telephone Encounter (Signed)
Pt reports that he is no longer coughing and is canceling his appt with Dr Beaulah Dinning.  FYI

## 2011-09-26 NOTE — Telephone Encounter (Signed)
Fine with me, if recurs will need to see Dr Beaulah Dinning

## 2011-10-03 ENCOUNTER — Telehealth: Payer: Self-pay | Admitting: Internal Medicine

## 2011-10-03 ENCOUNTER — Encounter: Payer: Self-pay | Admitting: Internal Medicine

## 2011-10-03 NOTE — Telephone Encounter (Signed)
Called spoke with patient but was unable to understand d/t bad reception.  Pt stated he will call back.  Will hold in triage.

## 2011-10-03 NOTE — Telephone Encounter (Signed)
Pt returned call. Kathleen W Perdue  

## 2011-10-03 NOTE — Telephone Encounter (Signed)
I spoke with the pt and he states he is having Parkinson like symptoms and wants a referral to a neurologists. I advised the pt that he will need to contact his PCP for this. Carron Curie, CMA'

## 2011-10-04 ENCOUNTER — Encounter: Payer: Self-pay | Admitting: Neurology

## 2011-12-07 ENCOUNTER — Ambulatory Visit: Payer: PRIVATE HEALTH INSURANCE | Admitting: Neurology

## 2011-12-19 ENCOUNTER — Ambulatory Visit (HOSPITAL_COMMUNITY): Payer: PRIVATE HEALTH INSURANCE | Admitting: Psychiatry

## 2012-01-16 ENCOUNTER — Ambulatory Visit (HOSPITAL_COMMUNITY): Payer: PRIVATE HEALTH INSURANCE | Admitting: Psychiatry

## 2012-01-23 ENCOUNTER — Ambulatory Visit: Payer: PRIVATE HEALTH INSURANCE | Admitting: Speech Pathology

## 2012-09-15 ENCOUNTER — Ambulatory Visit: Payer: Self-pay | Admitting: Diagnostic Neuroimaging

## 2012-10-21 IMAGING — CR DG CHEST 2V
2 series · 2 of 2 positions shown · non-contrast
Comparison: 06/21/2011.

CLINICAL DATA: Chronic cough, shortness of breath.

CHEST - 2 VIEW

[view not recorded (1 of 2)]
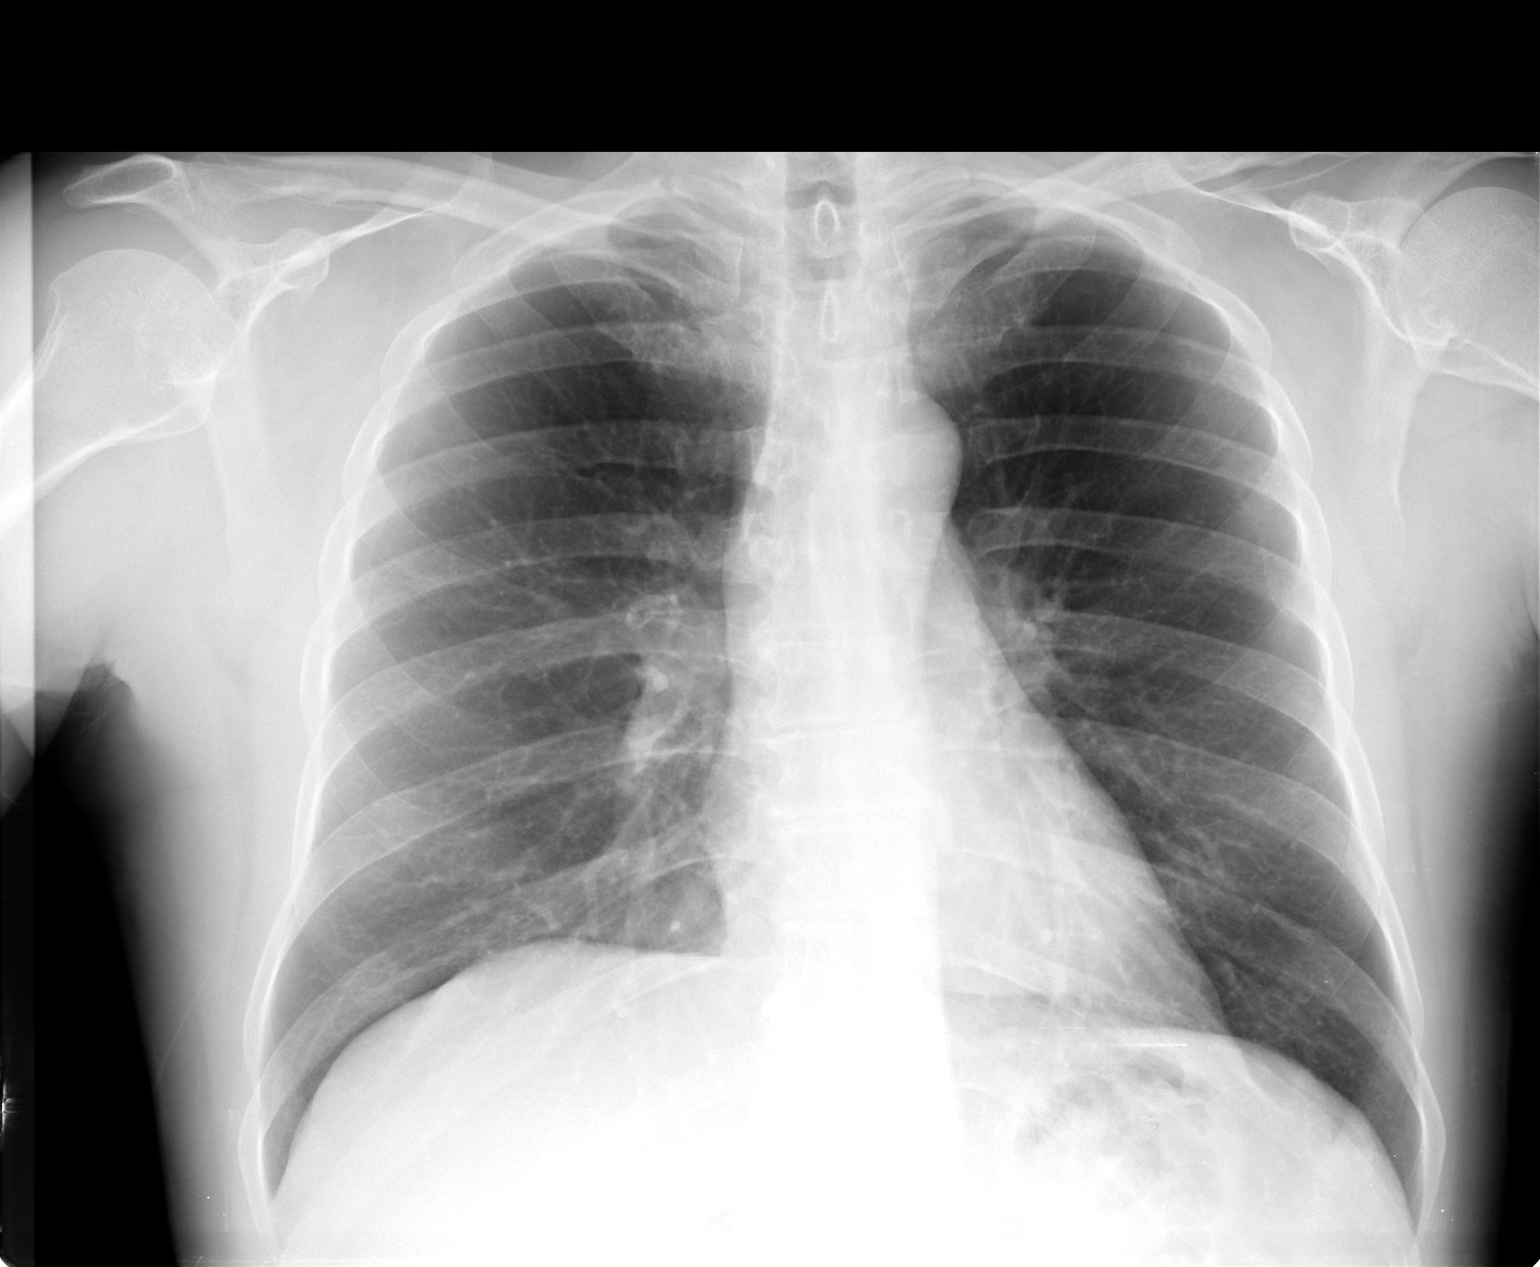

[view not recorded (2 of 2)]
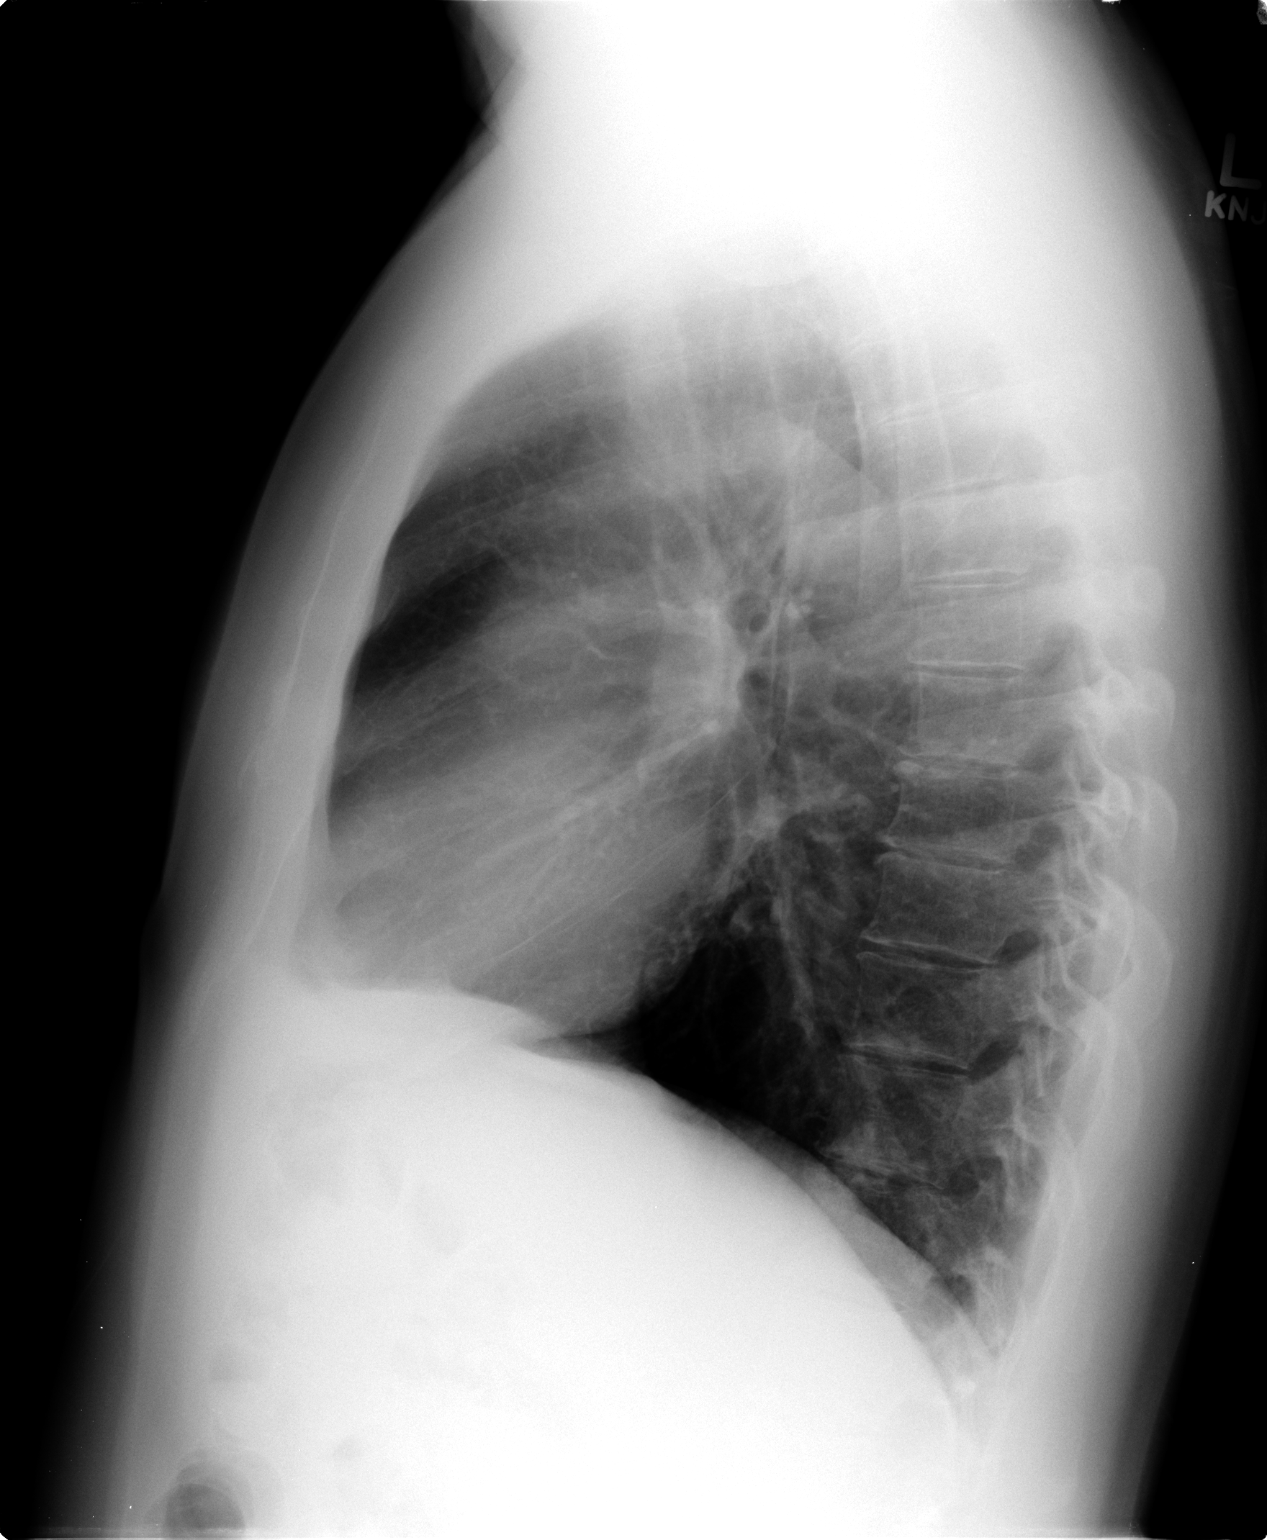

[2 of 2 positions shown; findings below may reference images not displayed]

FINDINGS: Heart and mediastinal contours are within normal limits.
No focal opacities or effusions.  No acute bony abnormality.
IMPRESSION: No active cardiopulmonary disease.

No change.

## 2013-03-27 ENCOUNTER — Other Ambulatory Visit (HOSPITAL_COMMUNITY): Payer: Self-pay | Admitting: Gastroenterology

## 2013-03-27 DIAGNOSIS — J690 Pneumonitis due to inhalation of food and vomit: Secondary | ICD-10-CM

## 2013-03-31 ENCOUNTER — Ambulatory Visit (HOSPITAL_COMMUNITY)
Admission: RE | Admit: 2013-03-31 | Discharge: 2013-03-31 | Disposition: A | Discharge status: Home / Self Care | Payer: PRIVATE HEALTH INSURANCE | Source: Ambulatory Visit | Attending: Gastroenterology | Admitting: Gastroenterology

## 2013-03-31 DIAGNOSIS — J690 Pneumonitis due to inhalation of food and vomit: Secondary | ICD-10-CM

## 2013-03-31 NOTE — Procedures (Signed)
Objective Swallowing Evaluation: Modified Barium Swallowing Study  Patient Details  Name: Joseph Adams MRN: [id number] Date of Birth: Dec 15, 1955  Today's Date: 03/31/2013 Time: 1300-1330 SLP Time Calculation (min): 30 min  Past Medical History:  Past Medical History  Diagnosis Date  . Asbestos exposure   . Hyperlipidemia   . Chronic cough     evaluated by Dr Sherene Sires; attributed to allergies  . GERD (gastroesophageal reflux disease)   . Anxiety attack    Past Surgical History:  Past Surgical History  Procedure Laterality Date  . Appendectomy  age 64  . Tonsillectomy    . Colonoscopy w/ polypectomy    . Upper gastrointestinal endoscopy     HPI:  This 57 year old male was referred by Dr. Ewing Schlein for an OP MBS secondary to concern for aspiration during meals.  Pt. reports coughing and throat clearing during and after meals for approximately 4-5 months.   PMH: Diagnosed with Parkinson's disease one year ago; GERD.     Assessment / Plan / Recommendation Clinical Impression  Dysphagia Diagnosis: Mild pharyngeal phase dysphagia Clinical impression: Pt. appears to have an essentially normal swallow, with only 1 time penetration to the cords noted with thin liquids when drinking through a straw.  This occurred near the end of the study, and may be related to fatigue.  Penetration occured after the swallow from a small amount of residue at the level of the UES.  Pt. did sense this, and coughed and cleared the penetrated material, preventing aspiration.   Again, this only occurred once during this study.    Treatment Recommendation  No treatment recommended at this time    Diet Recommendation Regular;Thin liquid   Liquid Administration via: Cup;No straw Medication Administration: Whole meds with puree Supervision: Patient able to self feed Compensations: Slow rate;Small sips/bites;Clear throat intermittently Postural Changes and/or Swallow Maneuvers: Out of bed for meals;Seated upright 90  degrees    Other  Recommendations Oral Care Recommendations: Oral care BID;Patient independent with oral care Other Recommendations: Clarify dietary restrictions   Follow Up Recommendations  None    Frequency and Duration        Pertinent Vitals/Pain n/a    SLP Swallow Goals   n/a  General HPI: This 57 year old male was referred by Dr. Ewing Schlein for an OP MBS secondary to concern for aspiration during meals.  Pt. reports coughing and throat clearing during and after meals for approximately 4-5 months.   PMH: Diagnosed with Parkinson's disease one year ago; GERD. Type of Study: Modified Barium Swallowing Study Reason for Referral: Objectively evaluate swallowing function Previous Swallow Assessment:  None found in [facility] system Diet Prior to this Study: Regular;Thin liquids Temperature Spikes Noted: No Respiratory Status: Room air History of Recent Intubation: No Behavior/Cognition: Alert;Cooperative;Pleasant mood Oral Cavity - Dentition: Adequate natural dentition Oral Motor / Sensory Function: Within functional limits Self-Feeding Abilities: Able to feed self Patient Positioning: Upright in chair Baseline Vocal Quality: Clear Volitional Cough: Strong Volitional Swallow: Able to elicit Anatomy: Within functional limits Pharyngeal Secretions: Not observed secondary MBS    Reason for Referral Objectively evaluate swallowing function   Oral Phase Oral Preparation/Oral Phase Oral Phase: WFL   Pharyngeal Phase Pharyngeal Phase Pharyngeal Phase: Impaired Pharyngeal - Thin Pharyngeal - Thin Straw: Penetration/Aspiration after swallow;Pharyngeal residue - cp segment;Reduced pharyngeal peristalsis;Pharyngeal residue - pyriform sinuses Penetration/Aspiration details (thin straw): Material enters airway, CONTACTS cords then ejected out  Cervical Esophageal Phase    GO    Cervical Esophageal  Phase Cervical Esophageal Phase: Vicente Masson T 03/31/2013, 4:22  PM

## 2013-04-02 NOTE — Progress Notes (Signed)
Late entry for missed G code   04/07/2013 1601  SLP G-Codes **NOT FOR INPATIENT CLASS**  Functional Assessment Tool Used clinical judgement  Functional Limitations Swallowing  Swallow Current Status [phone]) [facility]  Swallow Goal Status (U0454) [facility]  Swallow Discharge Status (U9811) [facility]  Maryjo Rochester T

## 2014-02-17 ENCOUNTER — Ambulatory Visit: Payer: PRIVATE HEALTH INSURANCE | Attending: Internal Medicine | Admitting: Physical Therapy

## 2014-02-17 DIAGNOSIS — IMO0001 Reserved for inherently not codable concepts without codable children: Secondary | ICD-10-CM | POA: Insufficient documentation

## 2014-02-17 DIAGNOSIS — R269 Unspecified abnormalities of gait and mobility: Secondary | ICD-10-CM | POA: Insufficient documentation

## 2014-02-18 ENCOUNTER — Ambulatory Visit: Payer: Commercial Managed Care - PPO | Attending: Internal Medicine | Admitting: Physical Therapy

## 2014-02-18 DIAGNOSIS — R279 Unspecified lack of coordination: Secondary | ICD-10-CM | POA: Insufficient documentation

## 2014-02-18 DIAGNOSIS — G20 Parkinson's disease: Secondary | ICD-10-CM | POA: Diagnosis not present

## 2014-02-18 DIAGNOSIS — R269 Unspecified abnormalities of gait and mobility: Secondary | ICD-10-CM | POA: Diagnosis not present

## 2014-02-24 ENCOUNTER — Ambulatory Visit: Payer: Commercial Managed Care - PPO

## 2014-02-24 DIAGNOSIS — G20 Parkinson's disease: Secondary | ICD-10-CM | POA: Diagnosis not present

## 2014-02-26 ENCOUNTER — Ambulatory Visit: Payer: Commercial Managed Care - PPO | Admitting: Physical Therapy

## 2014-02-26 DIAGNOSIS — G20 Parkinson's disease: Secondary | ICD-10-CM | POA: Diagnosis not present

## 2014-03-01 ENCOUNTER — Ambulatory Visit: Payer: Commercial Managed Care - PPO | Admitting: Physical Therapy

## 2014-03-01 DIAGNOSIS — G20 Parkinson's disease: Secondary | ICD-10-CM | POA: Diagnosis not present

## 2014-03-03 ENCOUNTER — Ambulatory Visit: Payer: Commercial Managed Care - PPO | Admitting: Occupational Therapy

## 2014-03-03 ENCOUNTER — Ambulatory Visit: Payer: Commercial Managed Care - PPO | Admitting: Physical Therapy

## 2014-03-03 DIAGNOSIS — G20 Parkinson's disease: Secondary | ICD-10-CM | POA: Diagnosis not present

## 2014-03-05 ENCOUNTER — Ambulatory Visit: Payer: Self-pay | Admitting: Physical Therapy

## 2014-03-08 ENCOUNTER — Ambulatory Visit: Payer: Commercial Managed Care - PPO | Admitting: Physical Therapy

## 2014-03-08 DIAGNOSIS — G20 Parkinson's disease: Secondary | ICD-10-CM | POA: Diagnosis not present

## 2014-03-09 ENCOUNTER — Ambulatory Visit: Payer: Commercial Managed Care - PPO

## 2014-03-09 ENCOUNTER — Ambulatory Visit: Payer: Commercial Managed Care - PPO | Admitting: Occupational Therapy

## 2014-03-09 DIAGNOSIS — G20 Parkinson's disease: Secondary | ICD-10-CM | POA: Diagnosis not present

## 2014-03-10 ENCOUNTER — Ambulatory Visit: Payer: Commercial Managed Care - PPO

## 2014-03-10 ENCOUNTER — Ambulatory Visit: Payer: Commercial Managed Care - PPO | Admitting: Occupational Therapy

## 2014-03-10 DIAGNOSIS — G20 Parkinson's disease: Secondary | ICD-10-CM | POA: Diagnosis not present

## 2014-03-12 ENCOUNTER — Ambulatory Visit: Payer: Commercial Managed Care - PPO | Admitting: Physical Therapy

## 2014-03-15 ENCOUNTER — Ambulatory Visit: Payer: Commercial Managed Care - PPO | Admitting: *Deleted

## 2014-03-15 ENCOUNTER — Ambulatory Visit: Payer: Commercial Managed Care - PPO | Admitting: Physical Therapy

## 2014-03-15 DIAGNOSIS — G20 Parkinson's disease: Secondary | ICD-10-CM | POA: Diagnosis not present

## 2014-03-17 ENCOUNTER — Ambulatory Visit: Payer: Commercial Managed Care - PPO | Admitting: Physical Therapy

## 2014-03-17 ENCOUNTER — Ambulatory Visit: Payer: Commercial Managed Care - PPO | Admitting: Occupational Therapy

## 2014-03-17 DIAGNOSIS — G20 Parkinson's disease: Secondary | ICD-10-CM | POA: Diagnosis not present

## 2014-03-19 ENCOUNTER — Ambulatory Visit: Payer: Self-pay | Admitting: Physical Therapy

## 2014-03-22 ENCOUNTER — Ambulatory Visit: Payer: Commercial Managed Care - PPO | Admitting: Occupational Therapy

## 2014-03-23 ENCOUNTER — Encounter: Payer: Self-pay | Admitting: Occupational Therapy

## 2014-03-25 ENCOUNTER — Ambulatory Visit: Payer: Commercial Managed Care - PPO | Attending: Internal Medicine | Admitting: Occupational Therapy

## 2014-03-25 ENCOUNTER — Encounter: Payer: Self-pay | Admitting: Occupational Therapy

## 2014-03-25 DIAGNOSIS — G20 Parkinson's disease: Secondary | ICD-10-CM | POA: Diagnosis not present

## 2014-03-25 DIAGNOSIS — R269 Unspecified abnormalities of gait and mobility: Secondary | ICD-10-CM | POA: Diagnosis not present

## 2014-03-25 DIAGNOSIS — M6289 Other specified disorders of muscle: Secondary | ICD-10-CM

## 2014-03-25 DIAGNOSIS — R279 Unspecified lack of coordination: Secondary | ICD-10-CM | POA: Diagnosis not present

## 2014-03-25 DIAGNOSIS — R258 Other abnormal involuntary movements: Secondary | ICD-10-CM

## 2014-03-25 DIAGNOSIS — Z7409 Other reduced mobility: Secondary | ICD-10-CM

## 2014-03-25 NOTE — Therapy (Signed)
Occupational Therapy Treatment  Patient Details  Name: Joseph Adams MRN: [id number] Date of Birth: Jun 28, 1955  Encounter Date: 03/25/2014      OT End of Session - 03/25/14 1022    Visit Number 5   Number of Visits 17   Date for OT Re-Evaluation 05/02/14  STG's due 04/03/14   OT Start Time 0815   OT Stop Time 0850   OT Time Calculation (min) 35 min   Activity Tolerance Patient tolerated treatment well      Past Medical History  Diagnosis Date  . Asbestos exposure   . Hyperlipidemia   . Chronic cough     evaluated by Dr Melvyn Novas; attributed to allergies  . GERD (gastroesophageal reflux disease)   . Anxiety attack     Past Surgical History  Procedure Laterality Date  . Appendectomy  age 45  . Tonsillectomy    . Colonoscopy w/ polypectomy    . Upper gastrointestinal endoscopy      There were no vitals taken for this visit.  Visit Diagnosis:  Lack of coordination  Muscle hypertonia  Bradykinesia  Mobility impaired          OT Treatments/Exercises (OP) - 03/25/14 0845    ADLs   Functional Mobility Side stepping bilaterally w/ cross reaching to retrieve/replace objects from cabinet. Emphasizing big movements and rotation   ADL Comments Simulated LE dressing emphasizing big movements and sit-stand for clothes mngmt with toileting.    Neurological Re-education Exercises   Other Exercises 1 Bag exercises to simulate LE dressing over feet, and UE dressing to don overhead shirt and jacket   Other Exercises 2 UBE X 5 min. Level 3 maintaining RPM 's b/t 35-45            OT Short Term Goals - 03/25/14 1028    OT SHORT TERM GOAL #1   Title Pt will be independent w/ PD-specific HEP   Time 2   Period Weeks   Status On-going   OT SHORT TERM GOAL #2   Title Pt will verbalize understanding of appropriate community resources and ways to prevent future complications prn   Time 2   Period Weeks   Status On-going   OT SHORT TERM GOAL #3   Title Pt will report  increased ease with adjusting clothing and buttoning and complete consistently without assist   Time 2   Period Weeks   Status On-going   OT SHORT TERM GOAL #4   Title Pt will improve ability to don/doff jacket as shown by improving time on PPT #4 by at least 5 sec.    Time 2   Period Weeks   Status On-going          OT Long Term Goals - 03/25/14 1032    OT LONG TERM GOAL #1   Title Pt will verbalize understanding of adaptive strategies to assist w/ ADLs/IADLs prn   Time 6   Period Weeks   Status On-going   OT LONG TERM GOAL #2   Title Pt will improve coordination as shown by improving time on 9 hole peg test by at least 3 sec. bilaterally   Time 6   Period Weeks   Status On-going   OT LONG TERM GOAL #3   Title Pt will improve functional reaching/ADL's as shown by improving socre on Box & Blocks test by at least 6 with dominant LUE   Time 6   Period Weeks   Status On-going   OT LONG TERM  GOAL #4   Title Pt will improve ability to don/doff jacket as shown by improving time on PPT#4 by at least 8 sec.    Time 6   Period Weeks   Status On-going   OT LONG TERM GOAL #5   Title Pt will be able to don socks mod I   Time 6   Period Weeks   Status On-going   Long Term Additional Goals   Additional Long Term Goals Yes   OT LONG TERM GOAL #6   Title Pt will report increased ease with manipulating keys, money/wallet, and opening containers   Time 6   Period Weeks   Status On-going          Plan - 03/25/14 1027    Clinical Impression Statement Pt doing well with ADLS, however reports difficulty w/ clothes mngmt for toileting.    OT Plan PWR moves, big movements w/ ADLS        Problem List Patient Active Problem List   Diagnosis Date Noted  . Dyspnea 07/16/2011  . Cough 07/13/2011                                            Carey Bullocks 03/25/2014, 10:41 AM

## 2014-03-29 ENCOUNTER — Ambulatory Visit: Payer: Commercial Managed Care - PPO | Admitting: Occupational Therapy

## 2014-03-29 DIAGNOSIS — Z7409 Other reduced mobility: Secondary | ICD-10-CM

## 2014-03-29 DIAGNOSIS — R258 Other abnormal involuntary movements: Secondary | ICD-10-CM

## 2014-03-29 DIAGNOSIS — R279 Unspecified lack of coordination: Secondary | ICD-10-CM

## 2014-03-29 DIAGNOSIS — M6289 Other specified disorders of muscle: Secondary | ICD-10-CM

## 2014-03-29 DIAGNOSIS — G20 Parkinson's disease: Secondary | ICD-10-CM | POA: Diagnosis not present

## 2014-03-29 NOTE — Patient Instructions (Addendum)
Performing Daily Activities with Big Movements  Pick at least one activity a day and perform with BIG, DELIBERATE movements/effort. This can make the activity easier and turn daily activities into exercise!  If you are standing during the activity, make sure to keep feet apart and stand with good/big/PWR! UP posture.  Examples:  Dressing - Push arms in sleeves, twist when putting on jacket, push foot into pants, open hands to pull down shirt/put on socks/pull up pants  Bathing - Wash/dry with long strokes  Brushing your teeth - Big, slow movements  Cutting food - Long deliberate cuts  Opening jar/bottle - Move as much as you can with each turn  Picking up a cup/bottle - Open hand up big and get object all the way in palm  Hanging up clothes/getting clothes down from closet - Reach with big effort  Putting away groceries/dishes - Reach with big effort  Wiping counter/table - Move in big, long strokes  Stirring while cooking - Exaggerate movement  Cleaning windows - Move in big, long strokes  Sweeping - Move arms in big, long strokes  Vacuuming - Push with big movement  Folding clothes - Exaggerate arm movements  Washing car - Move in big, long strokes  Raking - Move arms in big, long strokes  Changing light bulb - Move as much as you can with each turn  Using a screwdriver - Move as much as you can with each turn  Walking into a store/restaurant - Walk with big steps, swing arms if able  Standing up from a chair/recliner/sofa - Scoot forward, lean forward, and stand with big effort    Keeping Thinking Skills Sharp: 1. Jigsaw puzzles 2. Card/board games 3. Talking on the phone/social events 4. Lumosity.[email or web address] 5. Online games 6. Word serches/crossword puzzles 7.  Logic puzzles 8. Aerobic exercise (stationary bike) 9. Eating balanced diet (fruits & veggies) 10. Drink water 11. Try something new--new recipe, hobby 12. Crafts 13. Do a variety of activities that  are challenging 14. Add cognitive activities to walking/exercising (think of animal/food/city with each letter of the alphabet, counting backwards, thinking of as many vegetables as you can, etc.).--Only do this  If safe (no freezing/falls).

## 2014-03-29 NOTE — Therapy (Signed)
Occupational Therapy Treatment  Patient Details  Name: Joseph Adams MRN: [id number] Date of Birth: February 25, 1956  Encounter Date: 03/29/2014      OT End of Session - 03/29/14 1056    Visit Number 6   Number of Visits 17   Date for OT Re-Evaluation 05/02/14   Authorization Type UMR-no visit limit   OT Start Time 0934   OT Stop Time 1020   OT Time Calculation (min) 46 min   Activity Tolerance Patient tolerated treatment well      Past Medical History  Diagnosis Date  . Asbestos exposure   . Hyperlipidemia   . Chronic cough     evaluated by Dr Melvyn Novas; attributed to allergies  . GERD (gastroesophageal reflux disease)   . Anxiety attack     Past Surgical History  Procedure Laterality Date  . Appendectomy  age 44  . Tonsillectomy    . Colonoscopy w/ polypectomy    . Upper gastrointestinal endoscopy      There were no vitals taken for this visit.  Visit Diagnosis:  Lack of coordination  Muscle hypertonia  Bradykinesia  Mobility impaired          OT Treatments/Exercises (OP) - 03/29/14 1049    ADLs   LB Dressing Practiced using big movements for simulated donning/doffing pants with min v.c.  Practiced donning doffing vest and button-up shirt with min v.cues for big movements and trunk rotation.   ADL Comments Instructed pt in considerations to discuss with neurologist including timing and dosage of medications as pt reports increased difficulty in the a.m.   Also recommended that pt perform PWR! moves (hands, supine/sitting) in the a.m. before dressing to help with difficulty.  Reviewed importance of daily, high intensity, difficulty exercise to help prevent future complications and verbally reviewed HEP.  Pt verbalized understanding.          Education - 03/29/14 1033    Education provided Yes   Education Details Use of big movements with ADLs, PD Keeping Thinking Skills Paige    Education Details Patient   Methods Explanation;Handout   Comprehension Verbalized  understanding              Plan - 03/29/14 1057    Clinical Impression Statement Pt verbalized understanding of education provided, but continues to need reinforcement and repetition   OT Plan big movements with ADLs        Problem List Patient Active Problem List   Diagnosis Date Noted  . Dyspnea 07/16/2011  . Cough 07/13/2011                                            FREEMAN,ANGELA 03/29/2014, 1:29 PM

## 2014-04-01 ENCOUNTER — Ambulatory Visit: Payer: Commercial Managed Care - PPO | Admitting: Occupational Therapy

## 2014-04-01 DIAGNOSIS — R258 Other abnormal involuntary movements: Secondary | ICD-10-CM

## 2014-04-01 DIAGNOSIS — M6289 Other specified disorders of muscle: Secondary | ICD-10-CM

## 2014-04-01 DIAGNOSIS — R279 Unspecified lack of coordination: Secondary | ICD-10-CM

## 2014-04-01 DIAGNOSIS — G20 Parkinson's disease: Secondary | ICD-10-CM | POA: Diagnosis not present

## 2014-04-01 NOTE — Therapy (Signed)
Occupational Therapy Treatment  Patient Details  Name: Joseph Adams MRN: [id number] Date of Birth: 12/02/1955  Encounter Date: 04/01/2014      OT End of Session - 04/01/14 1334    Visit Number 7   Number of Visits 17   Date for OT Re-Evaluation 05/02/14   Authorization Type UMR-no visit limit   OT Start Time 0806   OT Stop Time 0845   OT Time Calculation (min) 39 min   Activity Tolerance Patient tolerated treatment well      Past Medical History  Diagnosis Date  . Asbestos exposure   . Hyperlipidemia   . Chronic cough     evaluated by Dr Melvyn Novas; attributed to allergies  . GERD (gastroesophageal reflux disease)   . Anxiety attack     Past Surgical History  Procedure Laterality Date  . Appendectomy  age 58  . Tonsillectomy    . Colonoscopy w/ polypectomy    . Upper gastrointestinal endoscopy      There were no vitals taken for this visit.  Visit Diagnosis:  Lack of coordination  Muscle hypertonia  Bradykinesia      Subjective Assessment - 04/01/14 1331    Currently in Pain? Yes   Pain Score 3    Pain Location Back   Pain Descriptors / Indicators Aching   Pain Type Acute pain   Pain Onset In the past 7 days   Pain Frequency Intermittent   Aggravating Factors  malpositioning/ repositioning alleviates   Multiple Pain Sites No              OT Education - 04/01/14 1332    Education provided Yes   Education Details PWR exercises in modified position for prone   Person(s) Educated Patient   Methods Explanation;Demonstration;Handout   Comprehension Verbalized understanding              Plan - 04/01/14 1337    Clinical Impression Statement Reviewed PWR exercises, modified prone with arms on mat and kneeling as pt reports prone exercises bother his back. Dynamic step and reach with emphasis on big movements, (large pegs and tossing scarves to target,) min v.c. ambulating while dual taksing min v.c Fine motor coordination, placing grooved pegs in  pegboard with LUE occasional min difficulty. UBE x 6 mins for conditioning, min v.c. for speed, pt only able to maintain 30 RPM   Rehab Potential Good   Clinical Impairments Affecting Rehab Potential decreased flexibility, cognition   OT Frequency 2x / week   OT Duration 8 weeks   OT Treatment/Interventions Self-care/ADL training;Parrafin;Moist Heat;Fluidtherapy;Therapeutic exercise;Neuromuscular education;Manual Therapy;Functional Mobility Training;DME and/or AE instruction;Passive range of motion;Therapeutic exercises;Therapeutic activities;Cognitive remediation/compensation;Patient/family education;Balance training   Plan Reinforce Big movments for ADLs., check short term goals next week   OT Home Exercise Plan progress as needed   Consulted and Agree with Plan of Care Patient        Problem List Patient Active Problem List   Diagnosis Date Noted  . Dyspnea 07/16/2011  . Cough 07/13/2011                                            Theone Murdoch, OTR/L  RINE,KATHRYN 04/01/2014, 1:47 PM

## 2014-04-06 ENCOUNTER — Encounter: Payer: Self-pay | Admitting: Occupational Therapy

## 2014-04-06 ENCOUNTER — Ambulatory Visit: Payer: Commercial Managed Care - PPO | Admitting: Occupational Therapy

## 2014-04-06 DIAGNOSIS — Z7409 Other reduced mobility: Secondary | ICD-10-CM

## 2014-04-06 DIAGNOSIS — R29898 Other symptoms and signs involving the musculoskeletal system: Secondary | ICD-10-CM

## 2014-04-06 DIAGNOSIS — G20 Parkinson's disease: Secondary | ICD-10-CM | POA: Diagnosis not present

## 2014-04-06 DIAGNOSIS — R279 Unspecified lack of coordination: Secondary | ICD-10-CM

## 2014-04-06 DIAGNOSIS — R258 Other abnormal involuntary movements: Secondary | ICD-10-CM

## 2014-04-06 NOTE — Therapy (Signed)
Occupational Therapy Treatment  Patient Details  Name: Joseph Adams MRN: [id number] Date of Birth: [birth year]  Encounter Date: 04/06/2014      OT End of Session - 04/06/14 0935    Visit Number 8   Number of Visits 17   Date for OT Re-Evaluation 05/02/14   Authorization Type UMR-no visit limit   OT Start Time 0834   OT Stop Time 0919   OT Time Calculation (min) 45 min   Activity Tolerance Patient tolerated treatment well      Past Medical History  Diagnosis Date  . Asbestos exposure   . Hyperlipidemia   . Chronic cough     evaluated by Dr Melvyn Novas; attributed to allergies  . GERD (gastroesophageal reflux disease)   . Anxiety attack     Past Surgical History  Procedure Laterality Date  . Appendectomy  age 58  . Tonsillectomy    . Colonoscopy w/ polypectomy    . Upper gastrointestinal endoscopy      There were no vitals taken for this visit.  Visit Diagnosis:  Lack of coordination  Bradykinesia  Mobility impaired  Rigidity      Subjective Assessment - 04/06/14 0836    Symptoms Pt reports that started Kickboxing last week and has been going to PD cycling class at the [facility]  Pt reports continued difficulty with getting things from wallet, putting on seatbelt, and picking up plastic bag handles.   Currently in Pain? No/denies            OT Treatments/Exercises (OP) - 04/06/14 0001    ADLs   UB Dressing Practiced buttoning shirt on table with use of PWR! hands between each button.  Practiced donning/doffing gown as jacket utilizing strategies/big movements with min cues/repetition.  Then practiced with vest with min difficutly.  PPT#4 in 17.50sec.   Neurological Re-education Exercises   Other Exercises 1 In standing, functional reaching to place large pegs in vertical pegboard incorporating BUEs with weight shift and trunk rotation and set-up for big movements, min cues for PWR! hands.  Then, tossing scarves in sitting reaching laterally and across body with  weight shift, trunk rotation and min cues for PWR! hands.  Then, tossing using each finger and thumb for individual finger pinch with mod cues for PWR! movements.   Other Exercises 2 Drawing bag into palm with focus on big movements for finger flex/extension with min cues/difficulty.  Placing grooved pegs in pegboard with each hand utilizing PWR! hands with grasp with min cues.          OT Education - 04/06/14 [phone]    Education provided Yes   Education Details strategies for donning/doffing jacket   Person(s) Educated Patient   Methods Explanation;Demonstration;Verbal cues   Comprehension Verbalized understanding;Returned demonstration;Verbal cues required          OT Short Term Goals - 04/06/14 0906    OT SHORT TERM GOAL #1   Title Pt will be independent w/ PD-specific HEP   Status Achieved   OT SHORT TERM GOAL #2   Title Pt will verbalize understanding of appropriate community resources and ways to prevent future complications prn   Status Achieved   OT SHORT TERM GOAL #3   Title Pt will report increased ease with adjusting clothing and buttoning and complete consistently without assist   Time --  continue through 05/02/14   Status On-going  Pt reports that he is buttoning mod I, but continues to need occasional min A for clothing adjustment  OT SHORT TERM GOAL #4   Title Pt will improve ability to don/doff jacket as shown by improving time on PPT #4 by at least 5 sec.    Status Achieved  17.50sec          OT Long Term Goals - 04/06/14 0910    OT LONG TERM GOAL #1   Title Pt will verbalize understanding of adaptive strategies to assist w/ ADLs/IADLs prn   Time --  05/02/14   Status On-going   OT LONG TERM GOAL #2   Title Pt will improve coordination as shown by improving time on 9 hole peg test by at least 3 sec. bilaterally   Time --  05/02/14   Status On-going   OT LONG TERM GOAL #3   Title Pt will improve functional reaching/ADL's as shown by improving socre  on Box & Blocks test by at least 6 with dominant LUE   Time --  05/02/14   Status On-going   OT LONG TERM GOAL #4   Title Pt will improve ability to don/doff jacket as shown by improving time on PPT#4 by at least 8 sec.    Time --  05/02/14   Status Achieved  17.5sec, improved by 8.78sec   OT LONG TERM GOAL #5   Title Pt will be able to don socks mod I   Status Achieved   OT LONG TERM GOAL #6   Title Pt will report increased ease with manipulating keys, money/wallet, and opening containers   Time --  05/02/14   Status On-going  Partially met.  Pt reports increased ease with manipulating keys and opening containers          Plan - 04/06/14 0936    Clinical Impression Statement Pt progressing with 3/4 STGs met and LTGs #4 and 5 met.   Plan big movements for functional tasks, strategies/simulated practice for donning seatbelt, getting items from wallet, and picking up plastic bag handles        Problem List Patient Active Problem List   Diagnosis Date Noted  . Dyspnea 07/16/2011  . Cough 07/13/2011                                             Vianne Bulls, OTR/L [facility] [address] [place], [place]  [address] Phone:   [phone] Fax:  [place] 04/06/2014, 9:46 AM

## 2014-04-08 ENCOUNTER — Ambulatory Visit: Payer: Commercial Managed Care - PPO | Admitting: Occupational Therapy

## 2014-04-08 DIAGNOSIS — G20 Parkinson's disease: Secondary | ICD-10-CM | POA: Diagnosis not present

## 2014-04-08 DIAGNOSIS — M6289 Other specified disorders of muscle: Secondary | ICD-10-CM

## 2014-04-08 DIAGNOSIS — R258 Other abnormal involuntary movements: Secondary | ICD-10-CM

## 2014-04-08 DIAGNOSIS — R29898 Other symptoms and signs involving the musculoskeletal system: Secondary | ICD-10-CM

## 2014-04-08 DIAGNOSIS — R279 Unspecified lack of coordination: Secondary | ICD-10-CM

## 2014-04-08 NOTE — Therapy (Signed)
Occupational Therapy Treatment  Patient Details  Name: BRYSAN MCEVOY MRN: [id number] Date of Birth: 22-Sep-1955  Encounter Date: 04/08/2014      OT End of Session - 04/08/14 0837    Visit Number 9   Number of Visits 17   Date for OT Re-Evaluation 05/02/14   Authorization Type UMR-no visit limit   OT Start Time 0808   OT Stop Time 0858   OT Time Calculation (min) 50 min   Activity Tolerance Patient tolerated treatment well   Behavior During Therapy [facility] for tasks assessed/performed      Past Medical History  Diagnosis Date  . Asbestos exposure   . Hyperlipidemia   . Chronic cough     evaluated by Dr Melvyn Novas; attributed to allergies  . GERD (gastroesophageal reflux disease)   . Anxiety attack     Past Surgical History  Procedure Laterality Date  . Appendectomy  age 58  . Tonsillectomy    . Colonoscopy w/ polypectomy    . Upper gastrointestinal endoscopy      There were no vitals taken for this visit.  Visit Diagnosis:  Lack of coordination  Bradykinesia  Rigidity  Muscle hypertonia      Subjective Assessment - 04/08/14 0836    Currently in Pain? Yes   Pain Score 5    Pain Location Knee   Pain Orientation Right   Pain Descriptors / Indicators Aching   Pain Type Acute pain   Pain Onset In the past 7 days   Pain Frequency Intermittent   Aggravating Factors  descending stairs   Pain Relieving Factors avoiding stairs   Multiple Pain Sites No                    Plan - 04/08/14 0842    Clinical Impression Statement simulated ADLS with big movements(bag exercises) behind head, behind back, crumpling in hand and underfeet to simulate dressing), min v.c., Pt practiced retrieving items from wallet, and placing items in wallet with good sucess, placing wallet in back pocket with only min difficulty, min v.c. for compensations.dynamic step and reach placing graded clothespins on vertical antennae with  bilateral UE's min v.c., Ambulating while dual  taksing(tossing ball and performing category generation) min difficulty/ v.c. Grooved pegboard with LUE min v.c., Handwriting activities with emphasis on size and legibility, min v.c., improved legibility noted. Tracing various shapes for increased hand eye coordination.UBE x 6 mins level 1 min v.c. for speed, Pt was able to maintain 30-40 RPM   Rehab Potential Poor   Clinical Impairments Affecting Rehab Potential decreased flexibility, cognition   OT Frequency 2x / week   OT Duration 8 weeks   OT Treatment/Interventions Self-care/ADL training;Parrafin;Moist Heat;Fluidtherapy;Therapeutic exercise;Neuromuscular education;Manual Therapy;Functional Mobility Training;DME and/or AE instruction;Passive range of motion;Therapeutic exercises;Therapeutic activities;Cognitive remediation/compensation;Patient/family education;Balance training   Plan big movements with functional tasks, putting on seatbelt, picking up plastic bag handles   OT Home Exercise Plan progress as needed        Problem List Patient Active Problem List   Diagnosis Date Noted  . Dyspnea 07/16/2011  . Cough 07/13/2011                                            Theone Murdoch, OTR/L  RINE,KATHRYN 04/08/2014, 9:13 AM

## 2014-04-12 ENCOUNTER — Encounter: Payer: Self-pay | Admitting: Occupational Therapy

## 2014-04-12 ENCOUNTER — Ambulatory Visit: Payer: Commercial Managed Care - PPO | Admitting: Occupational Therapy

## 2014-04-12 DIAGNOSIS — R258 Other abnormal involuntary movements: Secondary | ICD-10-CM

## 2014-04-12 DIAGNOSIS — G20 Parkinson's disease: Secondary | ICD-10-CM | POA: Diagnosis not present

## 2014-04-12 DIAGNOSIS — R29898 Other symptoms and signs involving the musculoskeletal system: Secondary | ICD-10-CM

## 2014-04-12 DIAGNOSIS — R279 Unspecified lack of coordination: Secondary | ICD-10-CM

## 2014-04-12 NOTE — Patient Instructions (Signed)
Extension (Assistive Putty)   Roll putty back and forth, being sure to use all fingertips. Repeat 3 times. Do 1 sessions per day.  Then pinch as below.   Palmar Pinch Strengthening (Resistive Putty)   Pinch putty between thumb and each fingertip in turn after rolling out    Finger and Thumb Extension (Resistive Putty)   With thumb and all fingers in center of putty donut, stretch out. Repeat 5-10 times. Do 1 sessions per day.

## 2014-04-12 NOTE — Therapy (Signed)
Occupational Therapy Treatment  Patient Details  Name: Joseph Adams MRN: [id number] Date of Birth: May 30, 1955  Encounter Date: 04/12/2014      OT End of Session - 04/12/14 0915    Visit Number 10   Number of Visits 17   Date for OT Re-Evaluation 05/02/14   Authorization Type UMR-no visit limit   OT Start Time 0840   OT Stop Time 0918   OT Time Calculation (min) 38 min   Activity Tolerance Patient tolerated treatment well      Past Medical History  Diagnosis Date  . Asbestos exposure   . Hyperlipidemia   . Chronic cough     evaluated by Dr Melvyn Novas; attributed to allergies  . GERD (gastroesophageal reflux disease)   . Anxiety attack     Past Surgical History  Procedure Laterality Date  . Appendectomy  age 58  . Tonsillectomy    . Colonoscopy w/ polypectomy    . Upper gastrointestinal endoscopy      There were no vitals taken for this visit.  Visit Diagnosis:  Lack of coordination  Rigidity  Bradykinesia      Subjective Assessment - 04/12/14 0844    Symptoms Had to stop kickboxing due to knee pain.  Today is a good day (for ADLs)   Currently in Pain? No/denies   Pain Score 8    Pain Location Knee   Pain Orientation Right   Aggravating Factors  descending stairs   Pain Relieving Factors avoiding stairs            OT Treatments/Exercises (OP) - 04/12/14 0001    ADLs   Home Maintenance Practiced picking up shopping bag handles with min v. cues to open hand.   Driving Simulated donning seatbelt with min cues for trunk rotation/big movements with only occasional min difficulty.   Neurological Re-education Exercises   Other Exercises 1 In sitting, functional reaching to place small pegs in vertical pegboard to copy design for cognitive component, coordination, and big reaching.  Copied with 100% accuracy.  Removed using in-hand manipulation each hand.     Other Exercises 2 Arm bike x45min for conditioning with min cues to for goal rpm of 40.  Pt able to  maintain 30-40rpms without rest.          OT Education - 04/12/14 0914    Education provided Yes   Education Details green putty HEP    Person(s) Educated Patient   Methods Explanation;Demonstration;Verbal cues;Handout   Comprehension Verbalized understanding;Returned demonstration              Plan - 04/12/14 0930    Clinical Impression Statement pt progressing well and demonstrated movement strategies during ADLs/simulated ADLs after min cues   Rehab Potential Good   Plan big movements with functional tasks, coordination        Problem List Patient Active Problem List   Diagnosis Date Noted  . Dyspnea 07/16/2011  . Cough 07/13/2011                                           Vianne Bulls, OTR/L [facility] [address] [place], [place]  [address] Phone:   [phone] Fax:  [place] 04/12/2014, 9:38 AM

## 2014-04-13 ENCOUNTER — Ambulatory Visit: Payer: Commercial Managed Care - PPO | Admitting: Occupational Therapy

## 2014-04-13 ENCOUNTER — Encounter: Payer: Self-pay | Admitting: Occupational Therapy

## 2014-04-13 DIAGNOSIS — R258 Other abnormal involuntary movements: Secondary | ICD-10-CM

## 2014-04-13 DIAGNOSIS — M6289 Other specified disorders of muscle: Secondary | ICD-10-CM

## 2014-04-13 DIAGNOSIS — R279 Unspecified lack of coordination: Secondary | ICD-10-CM

## 2014-04-13 DIAGNOSIS — G20 Parkinson's disease: Secondary | ICD-10-CM | POA: Diagnosis not present

## 2014-04-13 DIAGNOSIS — R29898 Other symptoms and signs involving the musculoskeletal system: Secondary | ICD-10-CM

## 2014-04-13 NOTE — Therapy (Signed)
Occupational Therapy Treatment  Patient Details  Name: Joseph Adams MRN: [id number] Date of Birth: 1956-02-09  Encounter Date: 04/13/2014      OT End of Session - 04/13/14 1050    Visit Number 11   Number of Visits 17   Date for OT Re-Evaluation 05/02/14   Authorization Type UMR-no visit limit   OT Start Time 0835   OT Stop Time 0915   OT Time Calculation (min) 40 min   Activity Tolerance Patient tolerated treatment well      Past Medical History  Diagnosis Date  . Asbestos exposure   . Hyperlipidemia   . Chronic cough     evaluated by Dr Melvyn Novas; attributed to allergies  . GERD (gastroesophageal reflux disease)   . Anxiety attack     Past Surgical History  Procedure Laterality Date  . Appendectomy  age 58  . Tonsillectomy    . Colonoscopy w/ polypectomy    . Upper gastrointestinal endoscopy      There were no vitals taken for this visit.  Visit Diagnosis:  Rigidity  Lack of coordination  Bradykinesia  Muscle hypertonia      Subjective Assessment - 04/13/14 0837    Symptoms I haven't pushed my knee so no pain today.   Currently in Pain? No/denies            OT Treatments/Exercises (OP) - 04/13/14 0001    ADLs   Functional Mobility Functional step and reach forward/back to place cylinder objects in overhead shelf with trunk rotation and in prep for turns with min-mod cues for PWR! step and reach.     Neurological Re-education Exercises   Other Exercises 1 In sitting, tossing scarf between hands with focus on big movements with min cues.  Sitting, functional reaching with each UE with trunk rotation, lateral weight shifts and focus on PWR! reach.  Then in sitting, trunk rotation with flipping cards with focus on trunk rotation, PWR! hands, supination with min cues.   Other Exercises 2 Arm bike x 81min level 1 for condiitoning and cues for goal of 40rpms.  Pt maintained 30-40rpms without rest but reportrs fatigue.     Fine Motor Coordination   Fine Motor  Coordination In hand manipuation training   In Hand Manipulation Training --  Rotating wooden pegs with ea hand with min difficulty              Plan - 04/13/14 1051    Clinical Impression Statement pt continues to progress with less cues for big movements overall   Plan coordination, big movements        Problem List Patient Active Problem List   Diagnosis Date Noted  . Dyspnea 07/16/2011  . Cough 07/13/2011                                           Vianne Bulls, OTR/L 04/13/2014 10:58 AM Phone: [phone] Fax: [phone]      [facility] 04/13/2014, 10:57 AM

## 2014-04-19 ENCOUNTER — Ambulatory Visit: Payer: Commercial Managed Care - PPO | Admitting: Occupational Therapy

## 2014-04-22 ENCOUNTER — Ambulatory Visit: Payer: Commercial Managed Care - PPO | Attending: Internal Medicine | Admitting: Occupational Therapy

## 2014-04-22 DIAGNOSIS — G20 Parkinson's disease: Secondary | ICD-10-CM | POA: Insufficient documentation

## 2014-04-22 DIAGNOSIS — R279 Unspecified lack of coordination: Secondary | ICD-10-CM

## 2014-04-22 DIAGNOSIS — R269 Unspecified abnormalities of gait and mobility: Secondary | ICD-10-CM | POA: Diagnosis not present

## 2014-04-22 DIAGNOSIS — R29898 Other symptoms and signs involving the musculoskeletal system: Secondary | ICD-10-CM

## 2014-04-22 DIAGNOSIS — R258 Other abnormal involuntary movements: Secondary | ICD-10-CM

## 2014-04-22 NOTE — Therapy (Signed)
[facility] [address] [place], [place], [address] Phone: [phone]   Fax:  [phone]  Occupational Therapy Treatment  Patient Details  Name: Joseph Adams MRN: [id number] Date of Birth: 01/26/1956  Encounter Date: 04/22/2014      OT End of Session - 04/22/14 0924    Visit Number 12   Number of Visits 17   Date for OT Re-Evaluation 05/02/14   Authorization Type UMR-no visit limit   OT Start Time 0850   OT Stop Time 0930   OT Time Calculation (min) 40 min   Activity Tolerance Patient tolerated treatment well   Behavior During Therapy [facility] for tasks assessed/performed      Past Medical History  Diagnosis Date  . Asbestos exposure   . Hyperlipidemia   . Chronic cough     evaluated by Dr Melvyn Novas; attributed to allergies  . GERD (gastroesophageal reflux disease)   . Anxiety attack     Past Surgical History  Procedure Laterality Date  . Appendectomy  age 31  . Tonsillectomy    . Colonoscopy w/ polypectomy    . Upper gastrointestinal endoscopy      There were no vitals taken for this visit.  Visit Diagnosis:  No diagnosis found.      Subjective Assessment - 04/22/14 0850    Currently in Pain? Yes   Pain Score 3    Pain Location Back   Pain Orientation Right   Pain Descriptors / Indicators Pressure;Jabbing   Pain Type Acute pain   Pain Onset In the past 7 days   Pain Frequency Intermittent   Aggravating Factors  with certain movements   Pain Relieving Factors walking   Multiple Pain Sites No                OT Short Term Goals - 04/22/14 1248    OT SHORT TERM GOAL #3   Title Pt will report increased ease with adjusting clothing and buttoning and complete consistently without assist   Baseline ck 04/30/14   Status On-going          OT Long Term Goals - 04/22/14 1246    OT LONG TERM GOAL #1   Title Pt will verbalize understanding of adaptive strategies to assist w/ ADLs/IADLs prn   Baseline ck  04/30/14   Status On-going   OT LONG TERM GOAL #2   Title Pt will improve coordination as shown by improving time on 9 hole peg test by at least 3 sec. bilaterally   Baseline ck 04/30/14   Status On-going   OT LONG TERM GOAL #3   Title Pt will improve functional reaching/ADL's as shown by improving socre on Box & Blocks test by at least 6 with dominant LUE   Baseline ck 04/30/14   Status On-going          Plan - 04/22/14 1237    Clinical Impression Statement Dynamic reaching with emphasis on Big movements, trunk rotation / PWR hands, to place large pegs in vertical pegbooard min v.c. Ambulating while dual tasking to toss a scarf in each hand alternately, and perform category generation, min- mod v.c. / mod difficulty maintaining big movements. UBE x 6 mins level 1 min v.c. for speed, pt able to maintain 30-40RPM. Fine motor coordination  to copy small peg design with LUE min v.c. Pt practiced donning/ doffing jacket, min v.c., improved technique   Rehab Potential Good   Clinical Impairments Affecting Rehab Potential decreased flexibility, cognition  OT Frequency 2x / week   OT Duration 8 weeks   OT Treatment/Interventions Self-care/ADL training;Parrafin;Moist Heat;Fluidtherapy;Therapeutic exercise;Neuromuscular education;Manual Therapy;Functional Mobility Training;DME and/or AE instruction;Passive range of motion;Therapeutic exercises;Therapeutic activities;Cognitive remediation/compensation;Patient/family education;Balance training   OT Home Exercise Plan Practice tub transfers with bench( pt is interested in one),  start checking goals,  anticipate d/c week of 04/30/14   Consulted and Agree with Plan of Care Patient                               Problem List Patient Active Problem List   Diagnosis Date Noted  . Dyspnea 07/16/2011  . Cough 07/13/2011    RINE,KATHRYN 04/22/2014, 12:49 PM Theone Murdoch, OTR/L [phone] Phone: [phone] 12:49  PM 04/22/2014

## 2014-04-26 ENCOUNTER — Ambulatory Visit: Payer: Commercial Managed Care - PPO | Admitting: Occupational Therapy

## 2014-04-26 ENCOUNTER — Encounter: Payer: Self-pay | Admitting: Occupational Therapy

## 2014-04-26 DIAGNOSIS — G20 Parkinson's disease: Secondary | ICD-10-CM | POA: Diagnosis not present

## 2014-04-26 DIAGNOSIS — R29898 Other symptoms and signs involving the musculoskeletal system: Secondary | ICD-10-CM

## 2014-04-26 DIAGNOSIS — R279 Unspecified lack of coordination: Secondary | ICD-10-CM

## 2014-04-26 DIAGNOSIS — R258 Other abnormal involuntary movements: Secondary | ICD-10-CM

## 2014-04-26 NOTE — Therapy (Signed)
[facility] [address] [place], [place], [address] Phone: [phone]   Fax:  [phone]  Occupational Therapy Treatment  Patient Details  Name: Joseph Adams MRN: [id number] Date of Birth: 05-18-1956  Encounter Date: 04/26/2014      OT End of Session - 04/26/14 1053    Visit Number 13   Number of Visits 17   Date for OT Re-Evaluation 05/02/14   Authorization Type UMR-no visit limit   OT Start Time 0835   OT Stop Time 0917   OT Time Calculation (min) 42 min   Activity Tolerance Patient tolerated treatment well      Past Medical History  Diagnosis Date  . Asbestos exposure   . Hyperlipidemia   . Chronic cough     evaluated by Dr Melvyn Novas; attributed to allergies  . GERD (gastroesophageal reflux disease)   . Anxiety attack     Past Surgical History  Procedure Laterality Date  . Appendectomy  age 58  . Tonsillectomy    . Colonoscopy w/ polypectomy    . Upper gastrointestinal endoscopy      There were no vitals taken for this visit.  Visit Diagnosis:  Rigidity  Lack of coordination  Bradykinesia      Subjective Assessment - 04/26/14 0838    Symptoms My neck is getting better.  Still need help occasional for clothing adjustment, continued difficulty with getting things from wallet.   Currently in Pain? No/denies            OT Treatments/Exercises (OP) - 04/26/14 0001    ADLs   Overall ADLs Began checking goals and discussing progress.   Bathing Instructed pt in process of obtaining tub bench with plans to demo/practice transfer next visit.   Functional Mobility Functional multi-directional movments with UE/LE for dual task/sequencing/coordinating movement with PWR! movements with min cues/difficulty for big movements.  Pt provided with handout and instructed in how to progress at home with use of colors.  Pt verbalized understanding.  Recommended pt placing sign/something that will remind him to take bigger  steps in areas around his home that he tends to take smaller ones as visual cue.  Pt verbalized understanding.   Financial Management Practiced sorting through paper money and counting with no significant difficulty.  Practiced manipulating coins in hand to place in targets while walking with min difficulty with mobility/smaller movements.  Recommended pt practice manipulating money in small/less busy/less stressful situations and use card that he gets out in advance for busy/stressful situations.  Pt verbalized understanding.   ADL Comments Discussed considerations for nutrition and PD based on pt questions. Recommended pt follow up with MD with possible nutritionist referral request.  Also issued Nutrition and PD booklet for pt to borrow and instructed him in how to request his own copy if desired.  Pt verbalized understanding.   Neurological Re-education Exercises   Other Exercises 1 Completing Purdue Pegboard with each hand with min difficulty/cues to use PWR! hands.          OT Education - 04/26/14 1049    Education provided --          OT Short Term Goals - 04/26/14 1055    OT SHORT TERM GOAL #3   Title Pt will report increased ease with adjusting clothing and buttoning and complete consistently without assist   Status Partially Met  occasional assist for clothing adjustments          OT Long Term Goals - 04/26/14 0998  OT LONG TERM GOAL #2   Title Pt will improve coordination as shown by improving time on 9 hole peg test by at least 3 sec. bilaterally   Status Not Met  R-27.50sec, L-25.25   OT LONG TERM GOAL #3   Title Pt will improve functional reaching/ADL's as shown by improving socre on Box & Blocks test by at least 6 with dominant LUE   Status Not Met  R-50blocks, L-51blocks          Plan - 04/26/14 1053    Clinical Impression Statement Pt reports that he is pleased with functional improvements for ADLs, but still has difficulty with clothing adjustment at  times and getting things from wallet in stressful situations.   Plan practice tub bench transfer, finish checking remaining goals, FOTO, d/c                               Problem List Patient Active Problem List   Diagnosis Date Noted  . Dyspnea 07/16/2011  . Cough 07/13/2011    FREEMAN,ANGELA 04/26/2014, 11:02 AM  Vianne Bulls, OTR/L 04/26/2014 11:02 AM Phone: [phone] Fax: [phone]

## 2014-04-29 ENCOUNTER — Ambulatory Visit: Payer: Commercial Managed Care - PPO | Admitting: Occupational Therapy

## 2014-05-04 ENCOUNTER — Ambulatory Visit: Payer: Commercial Managed Care - PPO | Admitting: Occupational Therapy

## 2014-05-05 ENCOUNTER — Ambulatory Visit: Payer: Commercial Managed Care - PPO | Admitting: Occupational Therapy

## 2014-05-05 DIAGNOSIS — R29898 Other symptoms and signs involving the musculoskeletal system: Secondary | ICD-10-CM

## 2014-05-05 DIAGNOSIS — R279 Unspecified lack of coordination: Secondary | ICD-10-CM

## 2014-05-05 DIAGNOSIS — G20 Parkinson's disease: Secondary | ICD-10-CM | POA: Diagnosis not present

## 2014-05-05 DIAGNOSIS — R258 Other abnormal involuntary movements: Secondary | ICD-10-CM

## 2014-05-05 NOTE — Therapy (Signed)
[facility] [address] [place], [place], [address] Phone: [phone]   Fax:  [phone]  Occupational Therapy Treatment  Patient Details  Name: Joseph Adams MRN: [id number] Date of Birth: Jun 02, 1955  Encounter Date: 05/05/2014      OT End of Session - 05/05/14 0920    Visit Number 14   Number of Visits 14   Authorization Type UMR-no visit limit   OT Start Time 0851   OT Stop Time 0930   OT Time Calculation (min) 39 min      Past Medical History  Diagnosis Date  . Asbestos exposure   . Hyperlipidemia   . Chronic cough     evaluated by Dr Melvyn Novas; attributed to allergies  . GERD (gastroesophageal reflux disease)   . Anxiety attack     Past Surgical History  Procedure Laterality Date  . Appendectomy  age 58  . Tonsillectomy    . Colonoscopy w/ polypectomy    . Upper gastrointestinal endoscopy      There were no vitals taken for this visit.  Visit Diagnosis:  Rigidity  Lack of coordination  Bradykinesia      Subjective Assessment - 05/05/14 0851    Currently in Pain? No/denies                OT Short Term Goals - 05/05/14 0855    OT SHORT TERM GOAL #1   Title Pt will be independent w/ PD-specific HEP   Status Achieved   OT SHORT TERM GOAL #2   Title Pt will verbalize understanding of appropriate community resources and ways to prevent future complications prn   Status Achieved   OT SHORT TERM GOAL #3   Title Pt will report increased ease with adjusting clothing and buttoning and complete consistently without assist   Status Partially Met   OT SHORT TERM GOAL #4   Title Pt will improve ability to don/doff jacket as shown by improving time on PPT #4 by at least 5 sec.    Status Achieved          OT Long Term Goals - 05/05/14 0856    OT LONG TERM GOAL #1   Title Pt will verbalize understanding of adaptive strategies to assist w/ ADLs/IADLs prn   Baseline Pt verbalizes understanding.   Status Achieved   OT LONG TERM GOAL #2   Title Pt will improve coordination as shown by improving time on 9 hole peg test by at least 3 sec. bilaterally   Status Not Met   OT LONG TERM GOAL #3   Title Pt will improve functional reaching/ADL's as shown by improving socre on Box & Blocks test by at least 6 with dominant LUE   Status Not Met   OT LONG TERM GOAL #4   Title Pt will improve ability to don/doff jacket as shown by improving time on PPT#4 by at least 8 sec.    Status Achieved   OT LONG TERM GOAL #5   Title Pt will be able to don socks mod I   Status Achieved   OT LONG TERM GOAL #6   Title Pt will report increased ease with manipulating keys, money/wallet, and opening containers   Baseline Pt reports increased ease with these tasks.   Status Achieved          Plan - 05/05/14 8916    Clinical Impression Statement Pt has made good progress and agrees to discharge.   Rehab Potential Good  Clinical Impairments Affecting Rehab Potential decreased flexibility, cognition   OT Frequency 2x / week   OT Duration 8 weeks   OT Treatment/Interventions Self-care/ADL training;Parrafin;Moist Heat;Fluidtherapy;Therapeutic exercise;Neuromuscular education;Manual Therapy;Functional Mobility Training;DME and/or AE instruction;Passive range of motion;Therapeutic exercises;Therapeutic activities;Cognitive remediation/compensation;Patient/family education;Balance training   Plan discharge from [facility] and Agree with Plan of Care Patient     Checked progress towards goals. Dynamic step and reach tossing scarves to target with emphasis on PWR hands/ big movements, min v.c.Amb while dual tasking(tossing scarves and performing category generation),minv.c. UBE x 6 mins level 3 for conditioning, min v.c. for speed, Pt able to maintain 37-40 RPM. Discussed use of tub bench vs. tub seat for safety with bathing(Pt practiced with tub seat.) He plans to try grab bar first. Grooved pegs for increased  fine motor coordination/ in hand manipulation with bilateral UE's. Reviewed putty HEP. Foto completed:  OCCUPATIONAL THERAPY DISCHARGE SUMMARY    Current functional level related to goals / functional outcomes: Pt made good progress achieving 3/5 long term goals.   Remaining deficits: Decreased.coordination, bradykinesia, rigidity,  Education / Equipment: Pt was educated regarding adapated strategies for ADLS, PD specific HEP and PD related community resources. Pt. verbalized understanding of all education. Plan: Patient agrees to discharge.  Patient goals were partially met. Patient is being discharged due to meeting the stated rehab goals.  ?????                              Problem List Patient Active Problem List   Diagnosis Date Noted  . Dyspnea 07/16/2011  . Cough 07/13/2011    Joseph Adams 05/05/2014, 4:15 PM  Theone Murdoch, OTR/L [phone] Phone: [phone] 4:27 PM 05/05/2014

## 2014-11-17 ENCOUNTER — Ambulatory Visit: Payer: Commercial Managed Care - PPO | Admitting: Occupational Therapy

## 2014-11-18 ENCOUNTER — Ambulatory Visit: Payer: Commercial Managed Care - PPO

## 2014-11-18 ENCOUNTER — Ambulatory Visit: Payer: Commercial Managed Care - PPO | Admitting: Occupational Therapy

## 2014-11-18 ENCOUNTER — Ambulatory Visit: Payer: Commercial Managed Care - PPO | Admitting: Physical Therapy

## 2015-02-28 ENCOUNTER — Encounter: Payer: Self-pay | Admitting: Occupational Therapy

## 2016-06-22 DIAGNOSIS — M5412 Radiculopathy, cervical region: Secondary | ICD-10-CM | POA: Diagnosis not present

## 2016-06-22 DIAGNOSIS — M542 Cervicalgia: Secondary | ICD-10-CM | POA: Diagnosis not present

## 2016-06-22 DIAGNOSIS — Z6829 Body mass index (BMI) 29.0-29.9, adult: Secondary | ICD-10-CM | POA: Diagnosis not present

## 2016-06-22 DIAGNOSIS — M4722 Other spondylosis with radiculopathy, cervical region: Secondary | ICD-10-CM | POA: Diagnosis not present

## 2016-07-04 DIAGNOSIS — Z0000 Encounter for general adult medical examination without abnormal findings: Secondary | ICD-10-CM | POA: Diagnosis not present

## 2016-07-04 DIAGNOSIS — Z86010 Personal history of colonic polyps: Secondary | ICD-10-CM | POA: Diagnosis not present

## 2016-07-04 DIAGNOSIS — N529 Male erectile dysfunction, unspecified: Secondary | ICD-10-CM | POA: Diagnosis not present

## 2016-07-04 DIAGNOSIS — E559 Vitamin D deficiency, unspecified: Secondary | ICD-10-CM | POA: Diagnosis not present

## 2016-07-04 DIAGNOSIS — Z125 Encounter for screening for malignant neoplasm of prostate: Secondary | ICD-10-CM | POA: Diagnosis not present

## 2016-07-04 DIAGNOSIS — E782 Mixed hyperlipidemia: Secondary | ICD-10-CM | POA: Diagnosis not present

## 2016-07-04 DIAGNOSIS — N400 Enlarged prostate without lower urinary tract symptoms: Secondary | ICD-10-CM | POA: Diagnosis not present

## 2016-07-04 DIAGNOSIS — G20 Parkinson's disease: Secondary | ICD-10-CM | POA: Diagnosis not present

## 2016-07-04 DIAGNOSIS — Z79899 Other long term (current) drug therapy: Secondary | ICD-10-CM | POA: Diagnosis not present

## 2016-07-04 DIAGNOSIS — K219 Gastro-esophageal reflux disease without esophagitis: Secondary | ICD-10-CM | POA: Diagnosis not present

## 2016-09-28 DIAGNOSIS — G20 Parkinson's disease: Secondary | ICD-10-CM | POA: Diagnosis not present

## 2016-09-28 DIAGNOSIS — G4752 REM sleep behavior disorder: Secondary | ICD-10-CM | POA: Diagnosis not present

## 2016-09-28 DIAGNOSIS — G4700 Insomnia, unspecified: Secondary | ICD-10-CM | POA: Diagnosis not present

## 2016-12-19 DIAGNOSIS — D126 Benign neoplasm of colon, unspecified: Secondary | ICD-10-CM | POA: Diagnosis not present

## 2016-12-19 DIAGNOSIS — K621 Rectal polyp: Secondary | ICD-10-CM | POA: Diagnosis not present

## 2016-12-19 DIAGNOSIS — Z86010 Personal history of colonic polyps: Secondary | ICD-10-CM | POA: Diagnosis not present

## 2016-12-19 DIAGNOSIS — K635 Polyp of colon: Secondary | ICD-10-CM | POA: Diagnosis not present

## 2016-12-24 DIAGNOSIS — K635 Polyp of colon: Secondary | ICD-10-CM | POA: Diagnosis not present

## 2016-12-24 DIAGNOSIS — D126 Benign neoplasm of colon, unspecified: Secondary | ICD-10-CM | POA: Diagnosis not present

## 2017-01-22 ENCOUNTER — Encounter: Payer: Self-pay | Admitting: Neurology

## 2017-02-06 ENCOUNTER — Encounter: Payer: Self-pay | Admitting: Neurology

## 2017-02-12 NOTE — Progress Notes (Signed)
Joseph Adams was seen today in the movement disorders clinic for neurologic consultation at the request of Little, Lennette Bihari, MD.  The consultation is for the evaluation of Parkinsons disease.  Pt previously received care at [facility] physicians.  He has also previously seen Dr. Leta Baptist.  He was last seen by his physician at [facility] neurology on 09/28/2016.  Records have been reviewed.  The patient's first symptom was left upper and lower extremity slowness and stiffness.  He saw Dr. Leta Baptist and was diagnosed with Parkinson's disease in 2013, but symptoms started about 2 years before that.  He was started on Azilect.  He took it for 8 weeks.  He did not find it to be beneficial.  He discontinued the medication.  Neupro is recommended then, but the patient read about side effects and did not want to take it.  He transitioned care to [facility] at that time.  He was subsequently started on levodopa.  It took him several years to get up to one tablet 3 times per day (started on this in 2013 or 2014 and was able to work up to one tablet 3 times a day by 2016).  At last visit in May, 2018 records indicate that the physician thought that this was "a little too much for him" and he was told to decrease it to half a tablet 3 times per day.   Pt reports he is currently on carbidopa/levodopa 25/100 1 po qid (8:30am/2pm/7pm/and may or may not take at bedtime).  If he has a "weird sensation" (sounds like RLS) he will take the last dose of medication.  He will sometimes have cramping of the feet then too but not in the middle of the night.  States that the first 4 hours of the medication are "okay" but then he has small steps.  "I have never tried to move the medication closer together."   Specific Symptoms:  Tremor: No. Family hx of similar:  No. Voice: was softer but better now.  Never did LSVT Sleep: sleeps well  Vivid Dreams:  Yes.  , very  Acting out dreams:  Yes.   , occasionally hits  wife Wet Pillows: No. Postural symptoms:  Yes.    Falls?  Yes.   Bradykinesia symptoms: shuffling gait and slow movements Loss of smell:  No. ("it was bad before I was diagnosed but I recovered that a little") Loss of taste:  No. Urinary Incontinence:  No. Difficulty Swallowing:  No. Handwriting, micrographia: Yes.   Trouble with ADL's:  Yes.  , some trouble but able to do that  Trouble buttoning clothing: Yes.   Depression:  No. Memory changes:  No. Hallucinations:  No.  visual distortions: No. N/V:  No. Lightheaded:  No.  Syncope: No. Diplopia:  Yes.  , started about a year ago.  Never tried to close an eye.  As soon as he moves glasses or blinks it is better Dyskinesia:  No.  Neuroimaging of the brain has previously been performed.  It is not available for my review today.  MRI brain done in 2016.  I see it was done but report and films unavailable  PREVIOUS MEDICATIONS: azilect Remeron (reports caused anxiety), Zoloft, Celexa, prozac  ALLERGIES:   Allergies  Allergen Reactions  . Fluoxetine Other (See Comments)    Felt hyper  . Sertraline Other (See Comments)    Muscle Stiffness    CURRENT MEDICATIONS:  Outpatient Encounter Prescriptions as of 02/14/2017  Medication Sig  . amitriptyline (ELAVIL) 10 MG tablet Take 20 mg by mouth at bedtime.  . carbidopa-levodopa (SINEMET IR) 25-100 MG per tablet Take 1 tablet by mouth 4 (four) times daily.   . tadalafil (CIALIS) 20 MG tablet Take 20 mg by mouth daily as needed for erectile dysfunction.  . [DISCONTINUED] amitriptyline (ELAVIL) 10 MG tablet Take 10 mg by mouth at bedtime.   No facility-administered encounter medications on file as of 02/14/2017.     PAST MEDICAL HISTORY:   Past Medical History:  Diagnosis Date  . Anxiety attack   . Asbestos exposure   . Chronic cough    evaluated by Dr Melvyn Novas; attributed to allergies  . GERD (gastroesophageal reflux disease)   . Hyperlipidemia    diet controlled  . Parkinson's  disease ([place])     PAST SURGICAL HISTORY:   Past Surgical History:  Procedure Laterality Date  . APPENDECTOMY  age 31  . COLONOSCOPY W/ POLYPECTOMY    . TONSILLECTOMY    . UPPER GASTROINTESTINAL ENDOSCOPY      SOCIAL HISTORY:   Social History   Social History  . Marital status: Married    Spouse name: N/A  . Number of children: 3  . Years of education: N/A   Occupational History  . retired     Government social research officer IT   Social History Main Topics  . Smoking status: Never Smoker  . Smokeless tobacco: Never Used  . Alcohol use No     Comment: "sometimes a sip of beer"  . Drug use: No  . Sexual activity: Not on file   Other Topics Concern  . Not on file   Social History Narrative  . No narrative on file    FAMILY HISTORY:   Family Status  Relation Status  . Father Deceased  . Mother Deceased  . Sister Deceased  . Daughter Alive  . Son Alive  . Neg Hx (Not Specified)    ROS:  A complete 10 system review of systems was obtained and was unremarkable apart from what is mentioned above.  PHYSICAL EXAMINATION:    VITALS:   Vitals:   02/14/17 0950  BP: 100/64  Pulse: 88  SpO2: 94%  Weight: 168 lb (76.2 kg)  Height: 5\' 6"  (1.676 m)    GEN:  The patient appears stated age and is in NAD. HEENT:  Normocephalic, atraumatic.  The mucous membranes are moist. The superficial temporal arteries are without ropiness or tenderness. CV:  RRR Lungs:  CTAB Neck/HEME:  There are no carotid bruits bilaterally.  Neurological examination:  Orientation: The patient is alert and oriented x3. Fund of knowledge is appropriate.  Recent and remote memory are intact.  Attention and concentration are normal.    Able to name objects and repeat phrases. Cranial nerves: There is good facial symmetry. There is facial hypomimia.  Pupils are equal round and reactive to light bilaterally. Fundoscopic exam reveals clear margins bilaterally. Extraocular muscles are intact. The visual fields are  full to confrontational testing. The speech is fluent and clear. Soft palate rises symmetrically and there is no tongue deviation. Hearing is intact to conversational tone. Sensation: Sensation is intact to light and pinprick throughout (facial, trunk, extremities). Vibration is intact at the bilateral big toe. There is no extinction with double simultaneous stimulation. There is no sensory dermatomal level identified. Motor: Strength is 5/5 in the bilateral upper and lower extremities.   Shoulder shrug is equal and symmetric.  There is no pronator  drift. Deep tendon reflexes: Deep tendon reflexes are 3/4 at the bilateral biceps, triceps, brachioradialis, patella and achilles. Plantar responses are downgoing bilaterally.  Movement examination: Tone: There is increased tone in the bilateral upper extremities, L more than R.  The tone in the lower extremities is normal.  Abnormal movements: none Coordination:  There is decremation with RAM's, with hand opening and closing bilaterally and finger taps bilaterally, L more than right.  All other ram's are good  including alternating supination and pronation of the forearm, heel taps and toe taps. Gait and Station: The patient has no difficulty arising out of a deep-seated chair without the use of the hands. The patient's stride length is good with decreased arm swing.  The patient has a negative pull test.      ASSESSMENT/PLAN:  1. Idiopathic Parkinson's disease, akinetic rigid type  -We discussed the diagnosis as well as pathophysiology of the disease.  We discussed treatment options as well as prognostic indicators.  Patient education was provided.  -We discussed that it used to be thought that levodopa would increase risk of melanoma but now it is believed that Parkinsons itself likely increases risk of melanoma. he is to get regular skin checks.  -Greater than 50% of the 60 minute visit was spent in counseling answering questions and talking about what  to expect now as well as in the future.  We talked about medication options as well as potential future surgical options.  We talked about safety in the home.  -I think that he needs to move carbidopa/levodopa dosages closer together.  Take at 8am/noon/4pm/8pm.  We could consider CR at bed for RLS but since last dosage at bed I think that this should work.  -We discussed community resources in the area including patient support groups and community exercise programs for PD and pt education was provided to the patient.  He Is already involved with rock steady boxing.  We talked about the [facility] cycling program.  -pt travels extensively and having some detox therapy in [place].  Travels internationally and asked me about mannitol for PD, which he saw being used in [place].  I have not seen scientific studies on this but have seen anectdoctal evidence.  I did not recommend it.  -The patient asked me about CBD oil.  Discussed literature on that as it relates to Parkinson's disease.  Not recommended by [facility] because of lack of controlled trials.  Talked about smaller trials in which marijuana helped tremor.  However, there is some data that suggests that it worsens cognition and falls.  The data also suggests that CBD oil is less effective than marijuana.  However, there have been concerns given the fact that CBD oil is unregulated and each manufacturer has different amounts of ingredient and the purity of each manufacturers ingredient has been called into question.  At this time, it is not recommended for the treatment of Parkinson's disease.  Further studies do need to be completed.  2.  RBD  -we did not add medication for this today but probably should consider klonopin in the future.  3  Follow up is anticipated in the next few months, sooner should new neurologic issues arise.      Cc:  Hulan Fess, MD

## 2017-02-14 ENCOUNTER — Encounter: Payer: Self-pay | Admitting: Neurology

## 2017-02-14 ENCOUNTER — Ambulatory Visit (INDEPENDENT_AMBULATORY_CARE_PROVIDER_SITE_OTHER): Payer: Medicare HMO | Admitting: Neurology

## 2017-02-14 VITALS — BP 100/64 | HR 88 | Ht 66.0 in | Wt 168.0 lb

## 2017-02-14 DIAGNOSIS — G20 Parkinson's disease: Secondary | ICD-10-CM | POA: Diagnosis not present

## 2017-02-14 DIAGNOSIS — G20A1 Parkinson's disease without dyskinesia, without mention of fluctuations: Secondary | ICD-10-CM

## 2017-02-14 DIAGNOSIS — G4752 REM sleep behavior disorder: Secondary | ICD-10-CM | POA: Insufficient documentation

## 2017-02-14 MED ORDER — CARBIDOPA-LEVODOPA 25-100 MG PO TABS: 1.0000 | ORAL_TABLET | Freq: Four times a day (QID) | ORAL | 1 refills | Status: DC

## 2017-02-14 NOTE — Patient Instructions (Signed)
1. Take Carbidopa Levodopa at 8am, 12 pm, 4 pm, 8 pm

## 2017-04-15 ENCOUNTER — Telehealth: Payer: Self-pay | Admitting: Neurology

## 2017-04-15 NOTE — Telephone Encounter (Signed)
Spoke with patient and made him aware Amitriptyline not prescribed by Dr. Carles Collet. He wants to know if you will write for it. Please advise.

## 2017-04-15 NOTE — Telephone Encounter (Signed)
Pt called and said he lost his prescription for amitriptyline and wanted to know if a new one can be sent to [facility]

## 2017-04-15 NOTE — Telephone Encounter (Signed)
What is he on it for?

## 2017-04-16 MED ORDER — AMITRIPTYLINE HCL 10 MG PO TABS: 20.0000 mg | ORAL_TABLET | Freq: Every day | ORAL | 5 refills | Status: DC

## 2017-04-16 NOTE — Telephone Encounter (Signed)
RX sent to pharmacy  

## 2017-04-16 NOTE — Telephone Encounter (Signed)
I will for now since low dose

## 2017-04-16 NOTE — Telephone Encounter (Signed)
Depression/anxiety.

## 2017-05-09 ENCOUNTER — Other Ambulatory Visit: Payer: Self-pay | Admitting: Neurology

## 2017-05-09 MED ORDER — AMITRIPTYLINE HCL 10 MG PO TABS: 20.0000 mg | ORAL_TABLET | Freq: Every day | ORAL | 0 refills | Status: DC

## 2017-05-09 NOTE — Telephone Encounter (Signed)
Patient requested 90 day supply.

## 2017-06-03 ENCOUNTER — Telehealth: Payer: Self-pay | Admitting: Neurology

## 2017-06-03 NOTE — Telephone Encounter (Signed)
Patient is going to a work program in [place] and they provided him a list of things they need from his physicians. He states he was not given forms, but it wasn't a medical records request. He will drop the information by for [place] to review.

## 2017-06-03 NOTE — Telephone Encounter (Signed)
Pt called and wants a call back regarding information needed for records out of the country

## 2017-06-07 ENCOUNTER — Telehealth: Payer: Self-pay | Admitting: Neurology

## 2017-06-12 ENCOUNTER — Encounter: Payer: Self-pay | Admitting: Neurology

## 2017-07-22 ENCOUNTER — Ambulatory Visit: Payer: Self-pay | Admitting: Neurology

## 2017-07-24 NOTE — Telephone Encounter (Signed)
Error

## 2017-08-05 ENCOUNTER — Other Ambulatory Visit: Payer: Self-pay | Admitting: Neurology

## 2017-08-05 MED ORDER — AMITRIPTYLINE HCL 10 MG PO TABS: 20.0000 mg | ORAL_TABLET | Freq: Every day | ORAL | 0 refills | Status: DC

## 2017-08-13 NOTE — Progress Notes (Signed)
Joseph Adams was seen today in the movement disorders clinic for neurologic consultation at the request of Little, Joseph Bihari, MD.  The consultation is for the evaluation of Parkinsons disease.  Pt previously received care at [facility] physicians.  He has also previously seen Dr. Leta Adams.  He was last seen by his physician at [facility] neurology on 09/28/2016.  Records have been reviewed.  The patient's first symptom was left upper and lower extremity slowness and stiffness.  He saw Dr. Leta Adams and was diagnosed with Parkinson's disease in 2013, but symptoms started about 2 years before that.  He was started on Azilect.  He took it for 8 weeks.  He did not find it to be beneficial.  He discontinued the medication.  Neupro is recommended then, but the patient read about side effects and did not want to take it.  He transitioned care to [facility] at that time.  He was subsequently started on levodopa.  It took him several years to get up to one tablet 3 times per day (started on this in 2013 or 2014 and was able to work up to one tablet 3 times a day by 2016).  At last visit in May, 2018 records indicate that the physician thought that this was "a little too much for him" and he was told to decrease it to half a tablet 3 times per day.   Pt reports he is currently on carbidopa/levodopa 25/100 1 po qid (8:30am/2pm/7pm/and may or may not take at bedtime).  If he has a "weird sensation" (sounds like RLS) he will take the last dose of medication.  He will sometimes have cramping of the feet then too but not in the middle of the night.  States that the first 4 hours of the medication are "okay" but then he has small steps.  "I have never tried to move the medication closer together."  08/14/17 update: Patient is seen today in follow-up.  This patient is accompanied in the office by his spouse who supplements the history. Last visit, I recommend he take carbidopa/levodopa 25/100 at 8 AM/noon/4 PM/8  p.m. He goes to bed at 11pm.   He notes wearing off after 3 hours and then he will having trouble walking.   Remains on low dose elavil, 20 mg,for mood.  Denies falls.  No hallucinations. No lightheadedness/near syncope.  Admits to chronic LBP.  Gets better as day goes on, worse after sleeping on it and awakening in the AM. Trouble sleeping when travelling.  Asks me if he could ask his friend for xanax if has trouble.   Exercising less due to extensive travel.    PREVIOUS MEDICATIONS: azilect Remeron (reports caused anxiety), Zoloft, Celexa, prozac; neupro (given but didn't take it)  ALLERGIES:   Allergies  Allergen Reactions  . Fluoxetine Other (See Comments)    Felt hyper  . Sertraline Other (See Comments)    Muscle Stiffness    CURRENT MEDICATIONS:  Outpatient Encounter Medications as of 08/14/2017  Medication Sig  . amitriptyline (ELAVIL) 10 MG tablet Take 2 tablets (20 mg total) by mouth at bedtime.  . carbidopa-levodopa (SINEMET IR) 25-100 MG tablet Take 1 tablet by mouth 4 (four) times daily.  . tadalafil (CIALIS) 20 MG tablet Take 20 mg by mouth daily as needed for erectile dysfunction.   No facility-administered encounter medications on file as of 08/14/2017.     PAST MEDICAL HISTORY:   Past Medical History:  Diagnosis Date  .  Anxiety attack   . Asbestos exposure   . Chronic cough    evaluated by Dr Melvyn Novas; attributed to allergies  . GERD (gastroesophageal reflux disease)   . Hyperlipidemia    diet controlled  . Parkinson's disease ([place])     PAST SURGICAL HISTORY:   Past Surgical History:  Procedure Laterality Date  . APPENDECTOMY  age 55  . COLONOSCOPY W/ POLYPECTOMY    . TONSILLECTOMY    . UPPER GASTROINTESTINAL ENDOSCOPY      SOCIAL HISTORY:   Social History   Socioeconomic History  . Marital status: Married    Spouse name: Not on file  . Number of children: 3  . Years of education: Not on file  . Highest education level: Not on file  Occupational History    . Occupation: retired    Comment: Government social research officer IT  Social Needs  . Financial resource strain: Not on file  . Food insecurity:    Worry: Not on file    Inability: Not on file  . Transportation needs:    Medical: Not on file    Non-medical: Not on file  Tobacco Use  . Smoking status: Never Smoker  . Smokeless tobacco: Never Used  Substance and Sexual Activity  . Alcohol use: No    Alcohol/week: 0.6 oz    Types: 1 Glasses of wine per week    Comment: "sometimes a sip of beer"  . Drug use: No  . Sexual activity: Not on file  Lifestyle  . Physical activity:    Days per week: Not on file    Minutes per session: Not on file  . Stress: Not on file  Relationships  . Social connections:    Talks on phone: Not on file    Gets together: Not on file    Attends religious service: Not on file    Active member of club or organization: Not on file    Attends meetings of clubs or organizations: Not on file    Relationship status: Not on file  . Intimate partner violence:    Fear of current or ex partner: Not on file    Emotionally abused: Not on file    Physically abused: Not on file    Forced sexual activity: Not on file  Other Topics Concern  . Not on file  Social History Narrative  . Not on file    FAMILY HISTORY:   Family Status  Relation Name Status  . Father  Deceased  . Mother  Deceased  . Sister  Deceased  . Daughter 2 Alive  . Son 1 Alive  . Neg Hx  (Not Specified)    ROS:  A complete 10 system review of systems was obtained and was unremarkable apart from what is mentioned above.  PHYSICAL EXAMINATION:    VITALS:   Vitals:   08/14/17 0810  BP: 136/72  Pulse: 82  SpO2: 98%  Weight: 178 lb (80.7 kg)  Height: 5\' 6"  (1.676 m)    GEN:  The patient appears stated age and is in NAD. HEENT:  Normocephalic, atraumatic.  The mucous membranes are moist. The superficial temporal arteries are without ropiness or tenderness. CV:  RRR Lungs:  CTAB Neck/HEME:   There are no carotid bruits bilaterally.  Neurological examination:  Orientation: The patient is alert and oriented x3. Cranial nerves: There is good facial symmetry.  There is facial hypomimia.  Visual fields are full.  Speech is fluent and clear, he is hypophonic.  Sensation: Sensation is intact to light touch throughout  Motor: Strength is 5/5 in the bilateral upper and lower extremities.   Shoulder shrug is equal and symmetric.  There is no pronator drift. Deep tendon reflexes: Deep tendon reflexes are 3/4 at the bilateral biceps, triceps, brachioradialis, patella and achilles. Plantar responses are downgoing bilaterally.  Movement examination: Tone: There is moderate increased tone in the bilateral upper extremities and right lower extremity. Abnormal movements: none Coordination:  There is decremation with RAM's, with hand opening and closing bilaterally and finger taps bilaterally, L more than right.  All other ram's are good  including alternating supination and pronation of the forearm, heel taps and toe taps. Gait and Station: The patient has no difficulty arising out of a deep-seated chair without the use of the hands. The patient's stride length is good with decreased arm swing on the left more than right.  The patient has a negative pull test.      ASSESSMENT/PLAN:  1. Idiopathic Parkinson's disease, akinetic rigid type  -We discussed the diagnosis as well as pathophysiology of the disease.  We discussed treatment options as well as prognostic indicators.  Patient education was provided.  -We discussed that it used to be thought that levodopa would increase risk of melanoma but now it is believed that Parkinsons itself likely increases risk of melanoma. he is to get regular skin checks.  -He will continue carbidopa/levodopa 25/100, 1 tablet at 8am/noon/4pm/8pm.    -We discussed and pramipexole.  We discussed how this works.  We discussed extensively risks, benefits, and side  effect which included but were not limited to sleep attacks and compulsive behaviors.  We will work to 0.5 mg 3 times per day.  He was agreeable.  Understanding expressed.  -pt travels extensively and having some detox therapy in [place].  Travels internationally and asked me about mannitol for PD, which he saw being used in [place].  I have not seen scientific studies on this but have seen anectdoctal evidence.  I did not recommend it.  2.  RBD  -we did not add medication for this today but probably should consider klonopin in the future.  3 insomnia  -Some of this is related to the extensive travel that he has and time spent trying to sleep on the plane.  Heasked if he could take a friends Xanax, but I told him not to do this.  I did recommend melatonin, 3 mg at night.  -He is already on Elocon 10 mg, 2 tablets at night.  When he travels he asked if he could take 4 tablets and I really have no objection to that.  We did talk about anticholinergic side effects with amitriptyline.  4.  Follow up is anticipated in the next few months, sooner should new neurologic issues arise.  Much greater than 50% of this visit was spent in counseling and coordinating care.  Total face to face time:  25 min      Cc:  Hulan Fess, MD

## 2017-08-14 ENCOUNTER — Ambulatory Visit: Payer: Medicare HMO | Admitting: Neurology

## 2017-08-14 ENCOUNTER — Encounter: Payer: Self-pay | Admitting: Neurology

## 2017-08-14 VITALS — BP 136/72 | HR 82 | Ht 66.0 in | Wt 178.0 lb

## 2017-08-14 DIAGNOSIS — G4700 Insomnia, unspecified: Secondary | ICD-10-CM

## 2017-08-14 DIAGNOSIS — G20 Parkinson's disease: Secondary | ICD-10-CM | POA: Diagnosis not present

## 2017-08-14 MED ORDER — CARBIDOPA-LEVODOPA 25-100 MG PO TABS: 1.0000 | ORAL_TABLET | Freq: Four times a day (QID) | ORAL | 1 refills | Status: AC

## 2017-08-14 MED ORDER — PRAMIPEXOLE DIHYDROCHLORIDE 0.5 MG PO TABS: 0.5000 mg | ORAL_TABLET | Freq: Three times a day (TID) | ORAL | 1 refills | Status: AC

## 2017-08-14 MED ORDER — AMITRIPTYLINE HCL 10 MG PO TABS: 20.0000 mg | ORAL_TABLET | Freq: Every day | ORAL | 1 refills | Status: AC

## 2017-08-14 MED ORDER — PRAMIPEXOLE DIHYDROCHLORIDE 0.125 MG PO TABS: ORAL_TABLET | ORAL | 0 refills | Status: AC

## 2017-08-14 NOTE — Patient Instructions (Addendum)
1. Start mirapex (pramipexole) as follows:  0.125 mg - 1 tablet three times per day for a week, then 2 tablets three times per day for a week and then fill the 0.5 mg tablet and take that, 1 pill three times per day  2. Refills of other medications sent to [facility].

## 2017-08-20 DIAGNOSIS — Z0100 Encounter for examination of eyes and vision without abnormal findings: Secondary | ICD-10-CM | POA: Diagnosis not present

## 2017-08-20 DIAGNOSIS — H2513 Age-related nuclear cataract, bilateral: Secondary | ICD-10-CM | POA: Diagnosis not present

## 2017-08-22 DIAGNOSIS — Z125 Encounter for screening for malignant neoplasm of prostate: Secondary | ICD-10-CM | POA: Diagnosis not present

## 2017-08-22 DIAGNOSIS — Z0000 Encounter for general adult medical examination without abnormal findings: Secondary | ICD-10-CM | POA: Diagnosis not present

## 2017-08-22 DIAGNOSIS — N529 Male erectile dysfunction, unspecified: Secondary | ICD-10-CM | POA: Diagnosis not present

## 2017-08-22 DIAGNOSIS — E782 Mixed hyperlipidemia: Secondary | ICD-10-CM | POA: Diagnosis not present

## 2017-08-22 DIAGNOSIS — N400 Enlarged prostate without lower urinary tract symptoms: Secondary | ICD-10-CM | POA: Diagnosis not present

## 2017-08-22 DIAGNOSIS — L989 Disorder of the skin and subcutaneous tissue, unspecified: Secondary | ICD-10-CM | POA: Diagnosis not present

## 2017-08-22 DIAGNOSIS — M545 Low back pain: Secondary | ICD-10-CM | POA: Diagnosis not present

## 2017-08-22 DIAGNOSIS — R69 Illness, unspecified: Secondary | ICD-10-CM | POA: Diagnosis not present

## 2017-08-22 DIAGNOSIS — Z79899 Other long term (current) drug therapy: Secondary | ICD-10-CM | POA: Diagnosis not present

## 2017-08-22 DIAGNOSIS — G20 Parkinson's disease: Secondary | ICD-10-CM | POA: Diagnosis not present

## 2017-08-22 DIAGNOSIS — Z86010 Personal history of colonic polyps: Secondary | ICD-10-CM | POA: Diagnosis not present

## 2017-08-28 DIAGNOSIS — Z0279 Encounter for issue of other medical certificate: Secondary | ICD-10-CM

## 2017-09-16 ENCOUNTER — Ambulatory Visit: Payer: Self-pay | Admitting: Neurology

## 2017-09-16 ENCOUNTER — Encounter

## 2019-11-10 DIAGNOSIS — Z0100 Encounter for examination of eyes and vision without abnormal findings: Secondary | ICD-10-CM | POA: Diagnosis not present

## 2019-11-10 DIAGNOSIS — H2513 Age-related nuclear cataract, bilateral: Secondary | ICD-10-CM | POA: Diagnosis not present

## 2019-11-11 DIAGNOSIS — Z125 Encounter for screening for malignant neoplasm of prostate: Secondary | ICD-10-CM | POA: Diagnosis not present

## 2019-11-11 DIAGNOSIS — E782 Mixed hyperlipidemia: Secondary | ICD-10-CM | POA: Diagnosis not present

## 2019-11-11 DIAGNOSIS — N400 Enlarged prostate without lower urinary tract symptoms: Secondary | ICD-10-CM | POA: Diagnosis not present

## 2019-11-11 DIAGNOSIS — Z0000 Encounter for general adult medical examination without abnormal findings: Secondary | ICD-10-CM | POA: Diagnosis not present

## 2019-11-11 DIAGNOSIS — G20 Parkinson's disease: Secondary | ICD-10-CM | POA: Diagnosis not present

## 2019-11-11 DIAGNOSIS — Z86010 Personal history of colonic polyps: Secondary | ICD-10-CM | POA: Diagnosis not present

## 2019-11-11 DIAGNOSIS — D510 Vitamin B12 deficiency anemia due to intrinsic factor deficiency: Secondary | ICD-10-CM | POA: Diagnosis not present

## 2019-11-11 DIAGNOSIS — Z1159 Encounter for screening for other viral diseases: Secondary | ICD-10-CM | POA: Diagnosis not present

## 2019-11-11 DIAGNOSIS — N529 Male erectile dysfunction, unspecified: Secondary | ICD-10-CM | POA: Diagnosis not present

## 2019-11-11 DIAGNOSIS — Z79899 Other long term (current) drug therapy: Secondary | ICD-10-CM | POA: Diagnosis not present

## 2019-11-26 NOTE — Progress Notes (Deleted)
Assessment/Plan:   1.  Parkinsons Disease  -***Continue carbidopa/levodopa 25/100, 1 tablet at 8 AM/noon/4 PM/8 PM  -Continue pramipexole, 0.5 mg 3 times per day.  No evidence of compulsive behaviors or sleep attacks.  -Patient was receiving some type of detox therapy in [place].  The borders are currently closed  2.  RBD  -Patient safety discussed.  States that it is not a big problem. Subjective:   Joseph Adams was seen today in follow up for Parkinsons disease.  My previous records were reviewed prior to todays visit as well as outside records available to me. Pt has not been seen in this office for 2-1/2 years.  Patient is accompanied by his wife who supplements the history.  When I last saw him in March, 2019, we started him on pramipexole.  I did not see him after that as he never followed up.  Reports today that *** denies falls.  Pt denies lightheadedness, near syncope.  No hallucinations.  Mood has been good.  In regards to RBD, the patient states that ***  Current prescribed movement disorder medications: ***Carbidopa/levodopa 25/100, 1 tablet at 8 AM/noon/4 PM/8 PM Pramipexole, 0.5 mg 3 times per day.   PREVIOUS MEDICATIONS: {Parkinson's RX:18200}  ALLERGIES:   Allergies  Allergen Reactions  . Fluoxetine Other (See Comments)    Felt hyper  . Sertraline Other (See Comments)    Muscle Stiffness    CURRENT MEDICATIONS:  Outpatient Encounter Medications as of 12/01/2019  Medication Sig  . amitriptyline (ELAVIL) 10 MG tablet Take 2-3 tablets (20-30 mg total) by mouth at bedtime.  . carbidopa-levodopa (SINEMET IR) 25-100 MG tablet Take 1 tablet by mouth 4 (four) times daily.  . pramipexole (MIRAPEX) 0.125 MG tablet 1 tablet TID for one week, then 2 tablets TID for one week, then switch to 0.5 mg tablets  . pramipexole (MIRAPEX) 0.5 MG tablet Take 1 tablet (0.5 mg total) by mouth 3 (three) times daily.  . tadalafil (CIALIS) 20 MG tablet Take 20 mg by mouth daily as needed  for erectile dysfunction.   No facility-administered encounter medications on file as of 12/01/2019.    Objective:   PHYSICAL EXAMINATION:    VITALS:  There were no vitals filed for this visit.  GEN:  The patient appears stated age and is in NAD. HEENT:  Normocephalic, atraumatic.  The mucous membranes are moist. The superficial temporal arteries are without ropiness or tenderness. CV:  RRR Lungs:  CTAB Neck/HEME:  There are no carotid bruits bilaterally.  Neurological examination:  Orientation: The patient is alert and oriented x3. Cranial nerves: There is good facial symmetry with*** facial hypomimia. The speech is fluent and clear. Soft palate rises symmetrically and there is no tongue deviation. Hearing is intact to conversational tone. Sensation: Sensation is intact to light touch throughout Motor: Strength is at least antigravity x4.  Movement examination: Tone: There is ***tone in the *** Abnormal movements: *** Coordination:  There is *** decremation with RAM's, *** Gait and Station: The patient has *** difficulty arising out of a deep-seated chair without the use of the hands. The patient's stride length is ***.  The patient has a *** pull test.     I have reviewed and interpreted the following labs independently    Chemistry   No results found for: NA, K, CL, CO2, BUN, CREATININE, GLU No results found for: CALCIUM, ALKPHOS, AST, ALT, BILITOT     Lab Results  Component Value Date   WBC  9.3 09/14/2011   HGB 14.4 09/14/2011   HCT 41.8 09/14/2011   MCV 86.4 09/14/2011   PLT 174 09/14/2011    No results found for: TSH   Total time spent on today's visit was ***30 minutes, including both face-to-face time and nonface-to-face time.  Time included that spent on review of records (prior notes available to me/labs/imaging if pertinent), discussing treatment and goals, answering patient's questions and coordinating care.  Cc:  Hulan Fess, MD

## 2019-12-01 ENCOUNTER — Ambulatory Visit: Payer: Medicare HMO | Admitting: Neurology

## 2019-12-01 DIAGNOSIS — Z20822 Contact with and (suspected) exposure to covid-19: Secondary | ICD-10-CM | POA: Diagnosis not present

## 2022-03-13 DIAGNOSIS — G4752 REM sleep behavior disorder: Secondary | ICD-10-CM | POA: Diagnosis not present

## 2022-03-13 DIAGNOSIS — F411 Generalized anxiety disorder: Secondary | ICD-10-CM | POA: Diagnosis not present

## 2022-03-13 DIAGNOSIS — E785 Hyperlipidemia, unspecified: Secondary | ICD-10-CM | POA: Diagnosis not present

## 2022-03-13 DIAGNOSIS — Z79899 Other long term (current) drug therapy: Secondary | ICD-10-CM | POA: Diagnosis not present

## 2022-03-13 DIAGNOSIS — R69 Illness, unspecified: Secondary | ICD-10-CM | POA: Diagnosis not present

## 2022-03-13 DIAGNOSIS — G20A1 Parkinson's disease without dyskinesia, without mention of fluctuations: Secondary | ICD-10-CM | POA: Diagnosis not present
